# Patient Record
Sex: Male | Born: 1935 | Race: White | Hispanic: No | Smoking: Former smoker
Health system: Southern US, Community
[De-identification: ages and names within clinical notes are randomized; demographics above are authoritative.]

## PROBLEM LIST (undated history)

## (undated) DIAGNOSIS — D631 Anemia in chronic kidney disease: Secondary | ICD-10-CM

## (undated) DIAGNOSIS — Z9289 Personal history of other medical treatment: Secondary | ICD-10-CM

## (undated) DIAGNOSIS — K7469 Other cirrhosis of liver: Secondary | ICD-10-CM

## (undated) DIAGNOSIS — I10 Essential (primary) hypertension: Secondary | ICD-10-CM

## (undated) DIAGNOSIS — I3139 Other pericardial effusion (noninflammatory): Secondary | ICD-10-CM

## (undated) DIAGNOSIS — I2510 Atherosclerotic heart disease of native coronary artery without angina pectoris: Secondary | ICD-10-CM

## (undated) DIAGNOSIS — I219 Acute myocardial infarction, unspecified: Secondary | ICD-10-CM

## (undated) DIAGNOSIS — F32A Depression, unspecified: Secondary | ICD-10-CM

## (undated) DIAGNOSIS — J9819 Other pulmonary collapse: Secondary | ICD-10-CM

## (undated) DIAGNOSIS — J90 Pleural effusion, not elsewhere classified: Secondary | ICD-10-CM

## (undated) DIAGNOSIS — B029 Zoster without complications: Principal | ICD-10-CM

## (undated) DIAGNOSIS — I639 Cerebral infarction, unspecified: Secondary | ICD-10-CM

## (undated) DIAGNOSIS — E119 Type 2 diabetes mellitus without complications: Secondary | ICD-10-CM

## (undated) DIAGNOSIS — N184 Chronic kidney disease, stage 4 (severe): Secondary | ICD-10-CM

## (undated) DIAGNOSIS — M109 Gout, unspecified: Secondary | ICD-10-CM

## (undated) DIAGNOSIS — M1990 Unspecified osteoarthritis, unspecified site: Secondary | ICD-10-CM

## (undated) DIAGNOSIS — R0602 Shortness of breath: Secondary | ICD-10-CM

## (undated) DIAGNOSIS — J449 Chronic obstructive pulmonary disease, unspecified: Secondary | ICD-10-CM

## (undated) DIAGNOSIS — Z8673 Personal history of transient ischemic attack (TIA), and cerebral infarction without residual deficits: Secondary | ICD-10-CM

## (undated) DIAGNOSIS — N183 Chronic kidney disease, stage 3 unspecified: Secondary | ICD-10-CM

## (undated) DIAGNOSIS — I313 Pericardial effusion (noninflammatory): Secondary | ICD-10-CM

## (undated) DIAGNOSIS — F329 Major depressive disorder, single episode, unspecified: Secondary | ICD-10-CM

## (undated) DIAGNOSIS — Z8489 Family history of other specified conditions: Secondary | ICD-10-CM

## (undated) HISTORY — DX: Zoster without complications: B02.9

## (undated) HISTORY — PX: ANKLE ARTHROPLASTY: SUR68

## (undated) HISTORY — PX: CORONARY ANGIOPLASTY WITH STENT PLACEMENT: SHX49

---

## 1969-04-28 HISTORY — PX: HEMORROIDECTOMY: SUR656

## 1969-04-28 HISTORY — PX: SHOULDER SURGERY: SHX246

## 1992-08-28 DIAGNOSIS — E119 Type 2 diabetes mellitus without complications: Secondary | ICD-10-CM

## 1992-08-28 HISTORY — DX: Type 2 diabetes mellitus without complications: E11.9

## 1999-11-25 ENCOUNTER — Encounter: Payer: Self-pay | Admitting: Orthopedic Surgery

## 1999-11-25 ENCOUNTER — Encounter: Admission: RE | Admit: 1999-11-25 | Discharge: 1999-11-25 | Payer: Self-pay | Admitting: Orthopedic Surgery

## 1999-11-28 ENCOUNTER — Ambulatory Visit (HOSPITAL_BASED_OUTPATIENT_CLINIC_OR_DEPARTMENT_OTHER): Admission: RE | Admit: 1999-11-28 | Discharge: 1999-11-29 | Payer: Self-pay | Admitting: Orthopedic Surgery

## 2001-03-06 ENCOUNTER — Ambulatory Visit (HOSPITAL_COMMUNITY): Admission: RE | Admit: 2001-03-06 | Discharge: 2001-03-06 | Payer: Self-pay | Admitting: Gastroenterology

## 2001-03-06 ENCOUNTER — Encounter (INDEPENDENT_AMBULATORY_CARE_PROVIDER_SITE_OTHER): Payer: Self-pay | Admitting: Specialist

## 2004-04-26 ENCOUNTER — Encounter: Admission: RE | Admit: 2004-04-26 | Discharge: 2004-04-26 | Payer: Self-pay | Admitting: Family Medicine

## 2004-08-12 ENCOUNTER — Inpatient Hospital Stay (HOSPITAL_COMMUNITY): Admission: EM | Admit: 2004-08-12 | Discharge: 2004-08-16 | Payer: Self-pay | Admitting: Emergency Medicine

## 2005-09-13 ENCOUNTER — Encounter: Admission: RE | Admit: 2005-09-13 | Discharge: 2005-09-13 | Payer: Self-pay | Admitting: Cardiology

## 2005-09-14 ENCOUNTER — Inpatient Hospital Stay (HOSPITAL_BASED_OUTPATIENT_CLINIC_OR_DEPARTMENT_OTHER): Admission: RE | Admit: 2005-09-14 | Discharge: 2005-09-14 | Payer: Self-pay | Admitting: Cardiology

## 2005-09-18 ENCOUNTER — Ambulatory Visit (HOSPITAL_COMMUNITY): Admission: RE | Admit: 2005-09-18 | Discharge: 2005-09-19 | Payer: Self-pay | Admitting: Cardiology

## 2006-05-17 ENCOUNTER — Encounter: Admission: RE | Admit: 2006-05-17 | Discharge: 2006-05-17 | Payer: Self-pay | Admitting: Cardiology

## 2006-05-23 ENCOUNTER — Inpatient Hospital Stay (HOSPITAL_COMMUNITY): Admission: AD | Admit: 2006-05-23 | Discharge: 2006-05-29 | Payer: Self-pay | Admitting: Cardiology

## 2006-05-24 ENCOUNTER — Encounter (INDEPENDENT_AMBULATORY_CARE_PROVIDER_SITE_OTHER): Payer: Self-pay | Admitting: Cardiology

## 2006-05-25 ENCOUNTER — Encounter (INDEPENDENT_AMBULATORY_CARE_PROVIDER_SITE_OTHER): Payer: Self-pay | Admitting: Specialist

## 2006-05-28 DIAGNOSIS — I639 Cerebral infarction, unspecified: Secondary | ICD-10-CM

## 2006-05-28 HISTORY — PX: PERICARDIAL WINDOW: SHX2213

## 2006-05-28 HISTORY — DX: Cerebral infarction, unspecified: I63.9

## 2006-06-05 ENCOUNTER — Encounter: Admission: RE | Admit: 2006-06-05 | Discharge: 2006-06-05 | Payer: Self-pay | Admitting: Cardiology

## 2006-06-13 ENCOUNTER — Ambulatory Visit: Payer: Self-pay | Admitting: Critical Care Medicine

## 2006-06-13 ENCOUNTER — Inpatient Hospital Stay (HOSPITAL_COMMUNITY): Admission: EM | Admit: 2006-06-13 | Discharge: 2006-06-21 | Payer: Self-pay | Admitting: Emergency Medicine

## 2006-06-13 HISTORY — PX: CEREBRAL ANGIOGRAM: SHX1326

## 2006-06-14 ENCOUNTER — Encounter (INDEPENDENT_AMBULATORY_CARE_PROVIDER_SITE_OTHER): Payer: Self-pay | Admitting: Neurology

## 2006-06-14 ENCOUNTER — Encounter (INDEPENDENT_AMBULATORY_CARE_PROVIDER_SITE_OTHER): Payer: Self-pay | Admitting: *Deleted

## 2006-06-20 ENCOUNTER — Ambulatory Visit: Payer: Self-pay | Admitting: Physical Medicine & Rehabilitation

## 2006-06-21 ENCOUNTER — Inpatient Hospital Stay (HOSPITAL_COMMUNITY)
Admission: RE | Admit: 2006-06-21 | Discharge: 2006-06-29 | Payer: Self-pay | Admitting: Physical Medicine & Rehabilitation

## 2006-06-29 ENCOUNTER — Emergency Department (HOSPITAL_COMMUNITY): Admission: EM | Admit: 2006-06-29 | Discharge: 2006-06-29 | Payer: Self-pay | Admitting: Emergency Medicine

## 2006-07-09 ENCOUNTER — Emergency Department (HOSPITAL_COMMUNITY): Admission: EM | Admit: 2006-07-09 | Discharge: 2006-07-09 | Payer: Self-pay | Admitting: Emergency Medicine

## 2006-08-07 ENCOUNTER — Ambulatory Visit: Payer: Self-pay | Admitting: Physical Medicine & Rehabilitation

## 2006-08-07 ENCOUNTER — Encounter
Admission: RE | Admit: 2006-08-07 | Discharge: 2006-11-05 | Payer: Self-pay | Admitting: Physical Medicine & Rehabilitation

## 2006-08-27 ENCOUNTER — Encounter
Admission: RE | Admit: 2006-08-27 | Discharge: 2006-10-09 | Payer: Self-pay | Admitting: Physical Medicine & Rehabilitation

## 2006-08-30 ENCOUNTER — Emergency Department (HOSPITAL_COMMUNITY): Admission: EM | Admit: 2006-08-30 | Discharge: 2006-08-30 | Payer: Self-pay | Admitting: Family Medicine

## 2006-10-09 ENCOUNTER — Encounter
Admission: RE | Admit: 2006-10-09 | Discharge: 2007-01-07 | Payer: Self-pay | Admitting: Physical Medicine & Rehabilitation

## 2006-10-11 ENCOUNTER — Ambulatory Visit: Payer: Self-pay | Admitting: Physical Medicine & Rehabilitation

## 2006-11-02 ENCOUNTER — Encounter: Admission: RE | Admit: 2006-11-02 | Discharge: 2006-11-02 | Payer: Self-pay | Admitting: Gastroenterology

## 2007-02-21 ENCOUNTER — Emergency Department (HOSPITAL_COMMUNITY): Admission: EM | Admit: 2007-02-21 | Discharge: 2007-02-21 | Payer: Self-pay | Admitting: Emergency Medicine

## 2007-03-08 ENCOUNTER — Ambulatory Visit: Payer: Self-pay | Admitting: Pulmonary Disease

## 2007-03-12 ENCOUNTER — Ambulatory Visit: Payer: Self-pay | Admitting: Cardiovascular Disease

## 2007-03-21 ENCOUNTER — Encounter: Payer: Self-pay | Admitting: Critical Care Medicine

## 2007-03-21 ENCOUNTER — Encounter: Payer: Self-pay | Admitting: Pulmonary Disease

## 2007-03-21 ENCOUNTER — Ambulatory Visit: Payer: Self-pay | Admitting: Pulmonary Disease

## 2007-03-21 ENCOUNTER — Other Ambulatory Visit: Admission: RE | Admit: 2007-03-21 | Discharge: 2007-03-21 | Payer: Self-pay | Admitting: Pediatrics

## 2007-03-21 LAB — CONVERTED CEMR LAB
AST: 26 units/L (ref 0–37)
Albumin: 3.1 g/dL — ABNORMAL LOW (ref 3.5–5.2)
Total Bilirubin: 1.3 mg/dL — ABNORMAL HIGH (ref 0.3–1.2)

## 2007-03-22 ENCOUNTER — Encounter: Payer: Self-pay | Admitting: Critical Care Medicine

## 2007-03-22 LAB — CONVERTED CEMR LAB: LD, Fluid: 96 units/L — ABNORMAL HIGH (ref 3–23)

## 2007-03-27 ENCOUNTER — Ambulatory Visit: Payer: Self-pay | Admitting: Gastroenterology

## 2007-06-11 ENCOUNTER — Ambulatory Visit: Payer: Self-pay | Admitting: Pulmonary Disease

## 2007-06-28 ENCOUNTER — Ambulatory Visit: Payer: Self-pay | Admitting: Pulmonary Disease

## 2007-07-16 ENCOUNTER — Ambulatory Visit: Payer: Self-pay | Admitting: Pulmonary Disease

## 2007-08-29 HISTORY — PX: CATARACT EXTRACTION W/ INTRAOCULAR LENS  IMPLANT, BILATERAL: SHX1307

## 2008-04-24 ENCOUNTER — Encounter (HOSPITAL_COMMUNITY): Admission: RE | Admit: 2008-04-24 | Discharge: 2008-06-30 | Payer: Self-pay | Admitting: Gastroenterology

## 2008-09-10 ENCOUNTER — Encounter: Payer: Self-pay | Admitting: Pulmonary Disease

## 2008-12-03 ENCOUNTER — Encounter (HOSPITAL_COMMUNITY): Admission: RE | Admit: 2008-12-03 | Discharge: 2009-03-03 | Payer: Self-pay | Admitting: Gastroenterology

## 2008-12-03 ENCOUNTER — Encounter: Admission: RE | Admit: 2008-12-03 | Discharge: 2008-12-03 | Payer: Self-pay | Admitting: Gastroenterology

## 2008-12-22 ENCOUNTER — Ambulatory Visit: Payer: Self-pay | Admitting: Pulmonary Disease

## 2008-12-22 DIAGNOSIS — J4489 Other specified chronic obstructive pulmonary disease: Secondary | ICD-10-CM

## 2008-12-22 DIAGNOSIS — J449 Chronic obstructive pulmonary disease, unspecified: Secondary | ICD-10-CM | POA: Insufficient documentation

## 2008-12-22 DIAGNOSIS — J90 Pleural effusion, not elsewhere classified: Secondary | ICD-10-CM | POA: Insufficient documentation

## 2008-12-22 HISTORY — DX: Other specified chronic obstructive pulmonary disease: J44.89

## 2008-12-22 HISTORY — DX: Chronic obstructive pulmonary disease, unspecified: J44.9

## 2009-05-16 ENCOUNTER — Emergency Department (HOSPITAL_COMMUNITY): Admission: EM | Admit: 2009-05-16 | Discharge: 2009-05-16 | Payer: Self-pay | Admitting: Emergency Medicine

## 2009-06-08 ENCOUNTER — Encounter (HOSPITAL_COMMUNITY): Admission: RE | Admit: 2009-06-08 | Discharge: 2009-09-06 | Payer: Self-pay | Admitting: Nephrology

## 2009-07-02 ENCOUNTER — Encounter: Admission: RE | Admit: 2009-07-02 | Discharge: 2009-07-02 | Payer: Self-pay | Admitting: Family Medicine

## 2009-07-07 ENCOUNTER — Ambulatory Visit: Payer: Self-pay | Admitting: Pulmonary Disease

## 2009-07-09 ENCOUNTER — Ambulatory Visit (HOSPITAL_COMMUNITY): Admission: RE | Admit: 2009-07-09 | Discharge: 2009-07-09 | Payer: Self-pay | Admitting: Pulmonary Disease

## 2009-07-09 ENCOUNTER — Encounter: Payer: Self-pay | Admitting: Pulmonary Disease

## 2009-07-09 ENCOUNTER — Encounter (INDEPENDENT_AMBULATORY_CARE_PROVIDER_SITE_OTHER): Payer: Self-pay | Admitting: Interventional Radiology

## 2009-07-19 ENCOUNTER — Ambulatory Visit: Payer: Self-pay | Admitting: Pulmonary Disease

## 2009-08-17 ENCOUNTER — Encounter: Payer: Self-pay | Admitting: Pulmonary Disease

## 2009-09-22 ENCOUNTER — Encounter: Admission: RE | Admit: 2009-09-22 | Discharge: 2009-09-22 | Payer: Self-pay | Admitting: Cardiology

## 2009-09-23 ENCOUNTER — Encounter (HOSPITAL_COMMUNITY): Admission: RE | Admit: 2009-09-23 | Discharge: 2009-12-06 | Payer: Self-pay | Admitting: Cardiology

## 2009-09-24 ENCOUNTER — Ambulatory Visit (HOSPITAL_COMMUNITY): Admission: RE | Admit: 2009-09-24 | Discharge: 2009-09-24 | Payer: Self-pay | Admitting: Cardiology

## 2009-11-29 ENCOUNTER — Encounter (HOSPITAL_COMMUNITY): Admission: RE | Admit: 2009-11-29 | Discharge: 2010-02-27 | Payer: Self-pay | Admitting: Nephrology

## 2009-12-04 ENCOUNTER — Emergency Department (HOSPITAL_COMMUNITY): Admission: EM | Admit: 2009-12-04 | Discharge: 2009-12-04 | Payer: Self-pay | Admitting: Emergency Medicine

## 2009-12-29 ENCOUNTER — Ambulatory Visit: Payer: Self-pay | Admitting: Surgery

## 2009-12-29 ENCOUNTER — Ambulatory Visit: Admission: RE | Admit: 2009-12-29 | Discharge: 2009-12-29 | Payer: Self-pay | Admitting: Gastroenterology

## 2009-12-29 ENCOUNTER — Encounter (INDEPENDENT_AMBULATORY_CARE_PROVIDER_SITE_OTHER): Payer: Self-pay | Admitting: Gastroenterology

## 2010-01-26 ENCOUNTER — Encounter (HOSPITAL_COMMUNITY): Admission: RE | Admit: 2010-01-26 | Discharge: 2010-04-26 | Payer: Self-pay | Admitting: Gastroenterology

## 2010-02-10 ENCOUNTER — Encounter: Admission: RE | Admit: 2010-02-10 | Discharge: 2010-02-10 | Payer: Self-pay | Admitting: Cardiology

## 2010-02-14 ENCOUNTER — Ambulatory Visit (HOSPITAL_COMMUNITY): Admission: RE | Admit: 2010-02-14 | Discharge: 2010-02-14 | Payer: Self-pay | Admitting: Cardiology

## 2010-02-17 ENCOUNTER — Ambulatory Visit: Payer: Self-pay | Admitting: Vascular Surgery

## 2010-03-30 ENCOUNTER — Encounter: Admission: RE | Admit: 2010-03-30 | Discharge: 2010-03-30 | Payer: Self-pay | Admitting: Family Medicine

## 2010-04-30 ENCOUNTER — Emergency Department (HOSPITAL_COMMUNITY): Admission: EM | Admit: 2010-04-30 | Discharge: 2010-05-01 | Payer: Self-pay | Admitting: Emergency Medicine

## 2010-05-03 ENCOUNTER — Encounter: Admission: RE | Admit: 2010-05-03 | Discharge: 2010-05-03 | Payer: Self-pay | Admitting: Cardiology

## 2010-05-10 ENCOUNTER — Encounter (HOSPITAL_COMMUNITY)
Admission: RE | Admit: 2010-05-10 | Discharge: 2010-08-08 | Payer: Self-pay | Source: Home / Self Care | Attending: Nephrology | Admitting: Nephrology

## 2010-05-21 IMAGING — CR DG ABDOMEN ACUTE W/ 1V CHEST
3 series · 3 of 3 positions shown · non-contrast
Comparison: 07/02/2009

CLINICAL DATA: , pain, vomiting.

ACUTE ABDOMEN SERIES (ABDOMEN 2 VIEW & CHEST 1 VIEW)

[w chest pa]
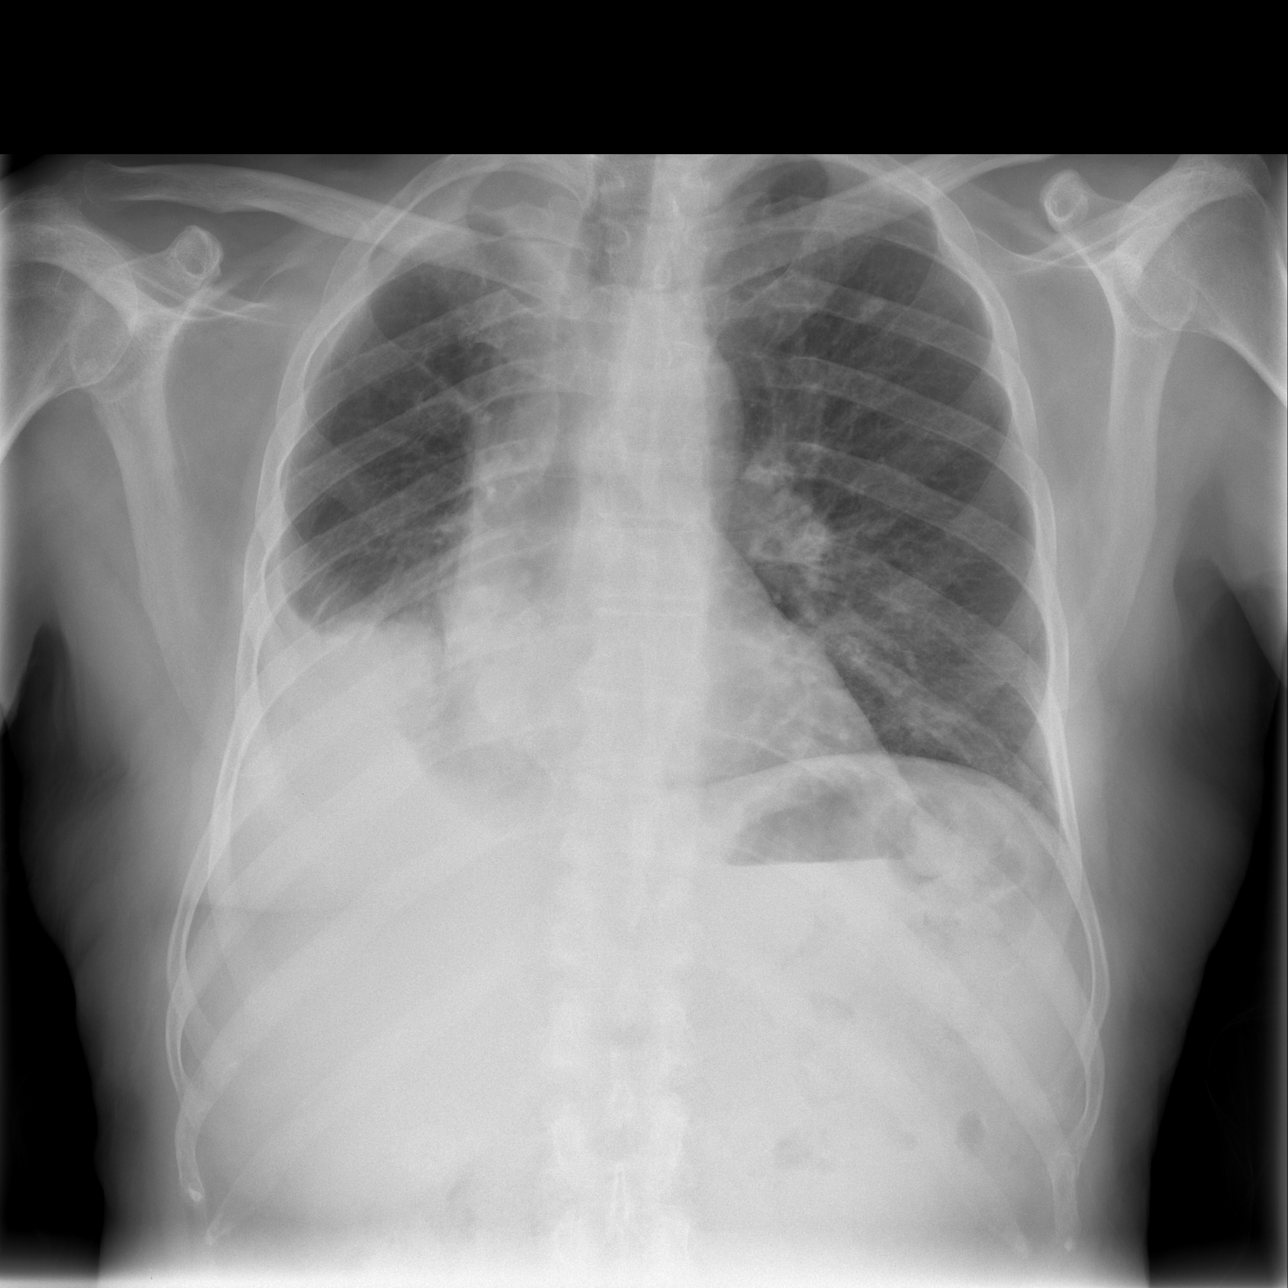

[w abdomen upright *]
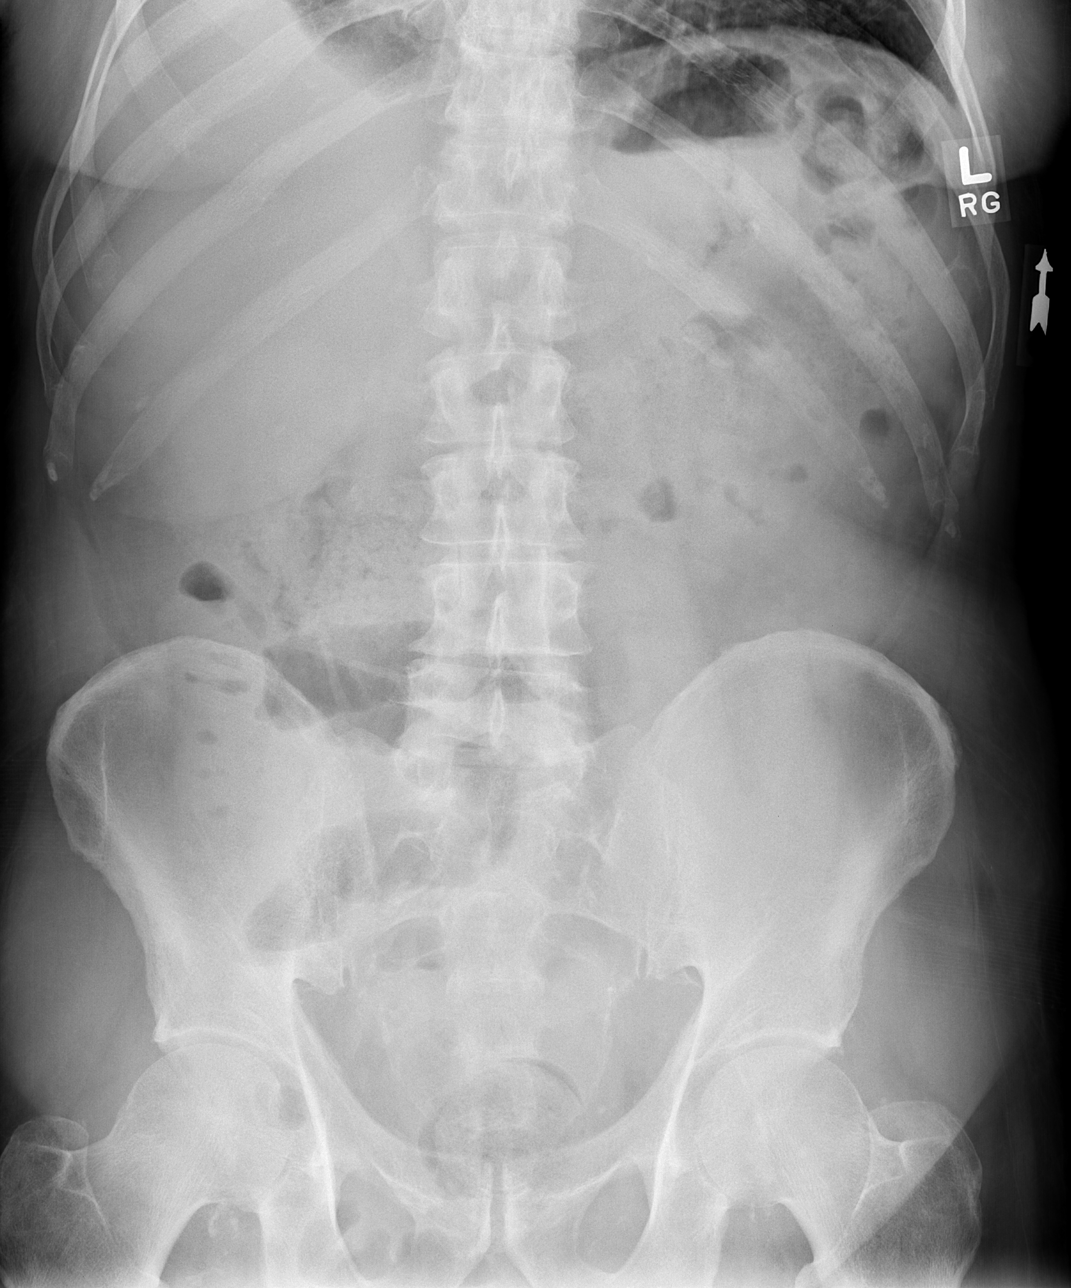

[t abdomen supine]
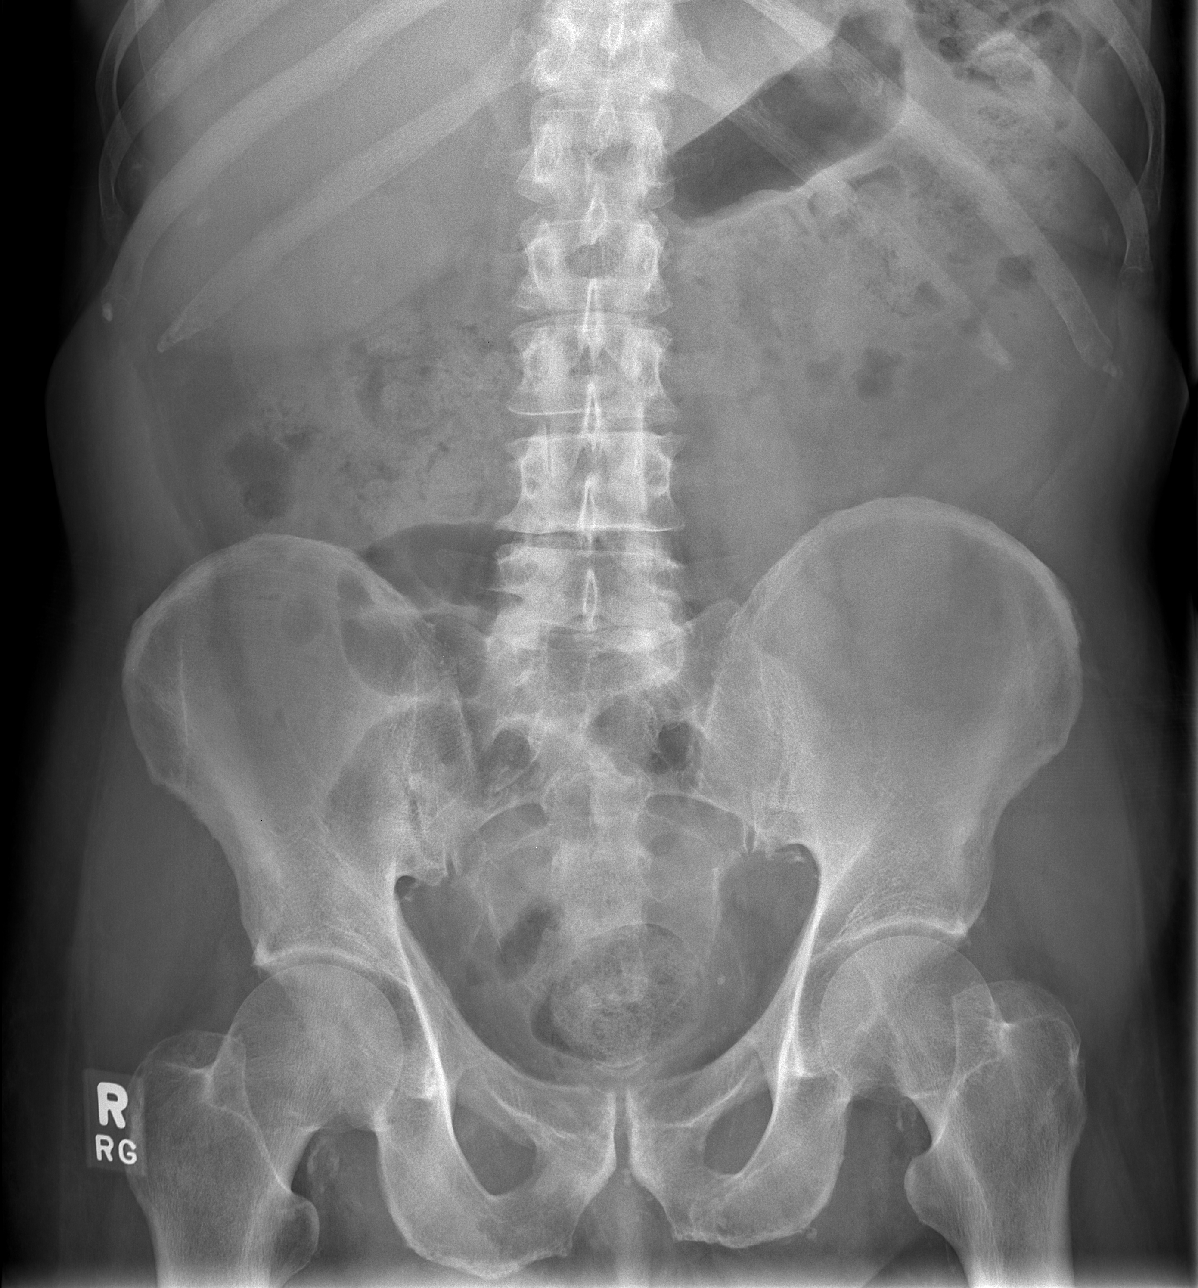

[3 of 3 positions shown; findings below may reference images not displayed]

FINDINGS: Chronic right pleural effusion and right lower lobe
opacities are seen, as seen on prior CT.  Left lung is clear.
Heart is normal size.

There is a nonobstructive bowel gas pattern.  No free air,
organomegaly or suspicious calcification.  No acute bony
abnormality.
IMPRESSION: No acute findings in the abdomen.

Chronic right pleural effusion and right lower lobe opacity.

## 2010-06-15 ENCOUNTER — Emergency Department (HOSPITAL_COMMUNITY): Admission: EM | Admit: 2010-06-15 | Discharge: 2010-06-15 | Payer: Self-pay | Admitting: Family Medicine

## 2010-08-16 ENCOUNTER — Encounter (HOSPITAL_COMMUNITY)
Admission: RE | Admit: 2010-08-16 | Discharge: 2010-09-27 | Payer: Self-pay | Source: Home / Self Care | Attending: Nephrology | Admitting: Nephrology

## 2010-09-27 LAB — IRON AND TIBC
Iron: 43 ug/dL (ref 42–135)
Saturation Ratios: 18 % — ABNORMAL LOW (ref 20–55)
TIBC: 235 ug/dL (ref 215–435)

## 2010-09-27 LAB — RENAL FUNCTION PANEL
BUN: 28 mg/dL — ABNORMAL HIGH (ref 6–23)
CO2: 29 mEq/L (ref 19–32)
Glucose, Bld: 134 mg/dL — ABNORMAL HIGH (ref 70–99)
Potassium: 3.4 mEq/L — ABNORMAL LOW (ref 3.5–5.1)
Sodium: 143 mEq/L (ref 135–145)

## 2010-09-27 LAB — POCT HEMOGLOBIN-HEMACUE: Hemoglobin: 11.4 g/dL — ABNORMAL LOW (ref 13.0–17.0)

## 2010-10-11 ENCOUNTER — Other Ambulatory Visit: Payer: Self-pay

## 2010-10-11 ENCOUNTER — Encounter (HOSPITAL_COMMUNITY): Payer: MEDICARE | Attending: Nephrology

## 2010-10-11 DIAGNOSIS — N184 Chronic kidney disease, stage 4 (severe): Secondary | ICD-10-CM | POA: Insufficient documentation

## 2010-10-11 DIAGNOSIS — D638 Anemia in other chronic diseases classified elsewhere: Secondary | ICD-10-CM | POA: Insufficient documentation

## 2010-10-17 ENCOUNTER — Encounter (HOSPITAL_COMMUNITY): Payer: MEDICARE

## 2010-10-25 ENCOUNTER — Other Ambulatory Visit: Payer: Self-pay | Admitting: Nephrology

## 2010-10-25 ENCOUNTER — Encounter (HOSPITAL_COMMUNITY): Payer: MEDICARE

## 2010-10-25 ENCOUNTER — Other Ambulatory Visit: Payer: Self-pay

## 2010-10-25 LAB — IRON AND TIBC
Iron: 52 ug/dL (ref 42–135)
Saturation Ratios: 23 % (ref 20–55)
TIBC: 225 ug/dL (ref 215–435)

## 2010-10-26 LAB — POCT HEMOGLOBIN-HEMACUE: Hemoglobin: 11.2 g/dL — ABNORMAL LOW (ref 13.0–17.0)

## 2010-11-07 LAB — POCT HEMOGLOBIN-HEMACUE: Hemoglobin: 10.8 g/dL — ABNORMAL LOW (ref 13.0–17.0)

## 2010-11-07 LAB — PTH, INTACT AND CALCIUM
Calcium, Total (PTH): 8.6 mg/dL (ref 8.4–10.5)
PTH: 85.2 pg/mL — ABNORMAL HIGH (ref 14.0–72.0)

## 2010-11-08 ENCOUNTER — Other Ambulatory Visit: Payer: Self-pay

## 2010-11-08 ENCOUNTER — Encounter (HOSPITAL_COMMUNITY): Payer: MEDICARE | Attending: Nephrology

## 2010-11-08 DIAGNOSIS — N184 Chronic kidney disease, stage 4 (severe): Secondary | ICD-10-CM | POA: Insufficient documentation

## 2010-11-08 DIAGNOSIS — D638 Anemia in other chronic diseases classified elsewhere: Secondary | ICD-10-CM | POA: Insufficient documentation

## 2010-11-08 LAB — FERRITIN: Ferritin: 200 ng/mL (ref 22–322)

## 2010-11-08 LAB — IRON AND TIBC
Saturation Ratios: 18 % — ABNORMAL LOW (ref 20–55)
TIBC: 219 ug/dL (ref 215–435)
UIBC: 180 ug/dL

## 2010-11-09 LAB — POCT URINALYSIS DIPSTICK
Bilirubin Urine: NEGATIVE
Glucose, UA: NEGATIVE mg/dL
Ketones, ur: NEGATIVE mg/dL
Specific Gravity, Urine: 1.01 (ref 1.005–1.030)

## 2010-11-09 LAB — POCT HEMOGLOBIN-HEMACUE
Hemoglobin: 10.7 g/dL — ABNORMAL LOW (ref 13.0–17.0)
Hemoglobin: 10.1 g/dL — ABNORMAL LOW (ref 13.0–17.0)

## 2010-11-09 LAB — IRON AND TIBC
Iron: 59 ug/dL (ref 42–135)
UIBC: 186 ug/dL

## 2010-11-10 LAB — POCT I-STAT, CHEM 8
BUN: 34 mg/dL — ABNORMAL HIGH (ref 6–23)
Creatinine, Ser: 2.1 mg/dL — ABNORMAL HIGH (ref 0.4–1.5)
Hemoglobin: 13.3 g/dL (ref 13.0–17.0)
Potassium: 5 mEq/L (ref 3.5–5.1)
Sodium: 139 mEq/L (ref 135–145)
TCO2: 29 mmol/L (ref 0–100)

## 2010-11-10 LAB — RENAL FUNCTION PANEL
BUN: 26 mg/dL — ABNORMAL HIGH (ref 6–23)
CO2: 29 mEq/L (ref 19–32)
Chloride: 102 mEq/L (ref 96–112)
Creatinine, Ser: 1.74 mg/dL — ABNORMAL HIGH (ref 0.4–1.5)
GFR calc non Af Amer: 38 mL/min — ABNORMAL LOW (ref >60)
Potassium: 4.7 mEq/L (ref 3.5–5.1)

## 2010-11-10 LAB — IRON AND TIBC
Iron: 36 ug/dL — ABNORMAL LOW (ref 42–135)
Iron: 64 ug/dL (ref 42–135)
Saturation Ratios: 15 % — ABNORMAL LOW (ref 20–55)
Saturation Ratios: 27 % (ref 20–55)
TIBC: 247 ug/dL (ref 215–435)
TIBC: 237 ug/dL (ref 215–435)
UIBC: 211 ug/dL

## 2010-11-10 LAB — POCT HEMOGLOBIN-HEMACUE
Hemoglobin: 11 g/dL — ABNORMAL LOW (ref 13.0–17.0)
Hemoglobin: 12.5 g/dL — ABNORMAL LOW (ref 13.0–17.0)

## 2010-11-11 LAB — POCT HEMOGLOBIN-HEMACUE
Hemoglobin: 14.1 g/dL (ref 13.0–17.0)
Hemoglobin: 14.2 g/dL (ref 13.0–17.0)

## 2010-11-11 LAB — FERRITIN: Ferritin: 635 ng/mL — ABNORMAL HIGH (ref 22–322)

## 2010-11-11 LAB — RENAL FUNCTION PANEL
Albumin: 3.2 g/dL — ABNORMAL LOW (ref 3.5–5.2)
BUN: 22 mg/dL (ref 6–23)
Chloride: 103 mEq/L (ref 96–112)
Creatinine, Ser: 1.85 mg/dL — ABNORMAL HIGH (ref 0.4–1.5)
Glucose, Bld: 198 mg/dL — ABNORMAL HIGH (ref 70–99)
Potassium: 4 mEq/L (ref 3.5–5.1)

## 2010-11-11 LAB — IRON AND TIBC
Iron: 51 ug/dL (ref 42–135)
Saturation Ratios: 23 % (ref 20–55)
TIBC: 224 ug/dL (ref 215–435)

## 2010-11-13 LAB — IRON AND TIBC
Iron: 17 ug/dL — ABNORMAL LOW (ref 42–135)
Saturation Ratios: 5 % — ABNORMAL LOW (ref 20–55)
UIBC: 305 ug/dL

## 2010-11-13 LAB — CROSSMATCH

## 2010-11-13 LAB — POCT HEMOGLOBIN-HEMACUE
Hemoglobin: 10.7 g/dL — ABNORMAL LOW (ref 13.0–17.0)
Hemoglobin: 8.7 g/dL — ABNORMAL LOW (ref 13.0–17.0)

## 2010-11-13 LAB — FERRITIN: Ferritin: 12 ng/mL — ABNORMAL LOW (ref 22–322)

## 2010-11-14 LAB — COMPREHENSIVE METABOLIC PANEL
Albumin: 3.3 g/dL — ABNORMAL LOW (ref 3.5–5.2)
BUN: 28 mg/dL — ABNORMAL HIGH (ref 6–23)
Calcium: 8.7 mg/dL (ref 8.4–10.5)
Creatinine, Ser: 2.07 mg/dL — ABNORMAL HIGH (ref 0.4–1.5)
Total Protein: 6.4 g/dL (ref 6.0–8.3)

## 2010-11-14 LAB — BODY FLUID CULTURE
Culture: NO GROWTH
Culture: NO GROWTH
Gram Stain: NONE SEEN

## 2010-11-14 LAB — POCT HEMOGLOBIN-HEMACUE
Hemoglobin: 10 g/dL — ABNORMAL LOW (ref 13.0–17.0)
Hemoglobin: 8.6 g/dL — ABNORMAL LOW (ref 13.0–17.0)
Hemoglobin: 8.9 g/dL — ABNORMAL LOW (ref 13.0–17.0)
Hemoglobin: 9.7 g/dL — ABNORMAL LOW (ref 13.0–17.0)

## 2010-11-14 LAB — PATHOLOGIST SMEAR REVIEW

## 2010-11-14 LAB — BODY FLUID CELL COUNT WITH DIFFERENTIAL
Eos, Fluid: 0 %
Monocyte-Macrophage-Serous Fluid: 14 % — ABNORMAL LOW (ref 50–90)
Other Cells, Fluid: 0 %
Total Nucleated Cell Count, Fluid: 128 cu mm (ref 0–1000)
Total Nucleated Cell Count, Fluid: 290 cu mm (ref 0–1000)

## 2010-11-14 LAB — LACTATE DEHYDROGENASE, PLEURAL OR PERITONEAL FLUID
LD, Fluid: 58 U/L — ABNORMAL HIGH (ref 3–23)
LD, Fluid: 63 U/L — ABNORMAL HIGH (ref 3–23)

## 2010-11-14 LAB — PROTEIN, BODY FLUID: Total protein, fluid: <3 g/dL

## 2010-11-14 LAB — GLUCOSE, CAPILLARY
Glucose-Capillary: 129 mg/dL — ABNORMAL HIGH (ref 70–99)
Glucose-Capillary: 192 mg/dL — ABNORMAL HIGH (ref 70–99)

## 2010-11-14 LAB — IRON AND TIBC
Iron: 46 ug/dL (ref 42–135)
Saturation Ratios: 16 % — ABNORMAL LOW (ref 20–55)
TIBC: 281 ug/dL (ref 215–435)
UIBC: 235 ug/dL

## 2010-11-14 LAB — CROSSMATCH: Antibody Screen: NEGATIVE

## 2010-11-15 LAB — POCT HEMOGLOBIN-HEMACUE: Hemoglobin: 9.7 g/dL — ABNORMAL LOW (ref 13.0–17.0)

## 2010-11-15 LAB — RENAL FUNCTION PANEL
CO2: 27 mEq/L (ref 19–32)
Calcium: 8.6 mg/dL (ref 8.4–10.5)
Glucose, Bld: 88 mg/dL (ref 70–99)
Sodium: 139 mEq/L (ref 135–145)

## 2010-11-16 LAB — GLUCOSE, CAPILLARY

## 2010-11-16 LAB — POCT HEMOGLOBIN-HEMACUE: Hemoglobin: 9.3 g/dL — ABNORMAL LOW (ref 13.0–17.0)

## 2010-11-22 ENCOUNTER — Other Ambulatory Visit: Payer: Self-pay | Admitting: Nephrology

## 2010-11-22 ENCOUNTER — Encounter (HOSPITAL_COMMUNITY): Payer: MEDICARE

## 2010-11-22 LAB — IRON AND TIBC: Saturation Ratios: 16 % — ABNORMAL LOW (ref 20–55)

## 2010-11-29 ENCOUNTER — Encounter (HOSPITAL_COMMUNITY): Payer: MEDICARE | Attending: Nephrology

## 2010-11-29 DIAGNOSIS — N184 Chronic kidney disease, stage 4 (severe): Secondary | ICD-10-CM | POA: Insufficient documentation

## 2010-11-29 DIAGNOSIS — D638 Anemia in other chronic diseases classified elsewhere: Secondary | ICD-10-CM | POA: Insufficient documentation

## 2010-11-30 LAB — BODY FLUID CELL COUNT WITH DIFFERENTIAL
Eos, Fluid: 0 %
Neutrophil Count, Fluid: 0 % (ref 0–25)
Total Nucleated Cell Count, Fluid: 345 cu mm (ref 0–1000)

## 2010-11-30 LAB — BODY FLUID CULTURE
Culture: NO GROWTH
Gram Stain: NONE SEEN

## 2010-11-30 LAB — LACTATE DEHYDROGENASE, PLEURAL OR PERITONEAL FLUID

## 2010-12-06 ENCOUNTER — Encounter (HOSPITAL_COMMUNITY): Payer: MEDICARE

## 2010-12-06 ENCOUNTER — Other Ambulatory Visit: Payer: Self-pay | Admitting: Nephrology

## 2010-12-06 ENCOUNTER — Other Ambulatory Visit: Payer: Self-pay | Admitting: Cardiology

## 2010-12-06 ENCOUNTER — Ambulatory Visit
Admission: RE | Admit: 2010-12-06 | Discharge: 2010-12-06 | Disposition: A | Discharge status: Home / Self Care | Payer: MEDICARE | Source: Ambulatory Visit | Attending: Cardiology | Admitting: Cardiology

## 2010-12-06 DIAGNOSIS — J90 Pleural effusion, not elsewhere classified: Secondary | ICD-10-CM

## 2010-12-06 LAB — CROSSMATCH
ABO/RH(D): O NEG
Antibody Screen: NEGATIVE

## 2010-12-06 LAB — GLUCOSE, CAPILLARY: Glucose-Capillary: 209 mg/dL — ABNORMAL HIGH (ref 70–99)

## 2010-12-07 LAB — CROSSMATCH: ABO/RH(D): O NEG

## 2010-12-20 ENCOUNTER — Encounter (HOSPITAL_COMMUNITY): Payer: MEDICARE

## 2010-12-20 ENCOUNTER — Other Ambulatory Visit: Payer: Self-pay | Admitting: Nephrology

## 2011-01-03 ENCOUNTER — Other Ambulatory Visit: Payer: Self-pay | Admitting: Nephrology

## 2011-01-03 ENCOUNTER — Encounter (HOSPITAL_COMMUNITY): Payer: MEDICARE | Attending: Nephrology

## 2011-01-03 DIAGNOSIS — N184 Chronic kidney disease, stage 4 (severe): Secondary | ICD-10-CM | POA: Insufficient documentation

## 2011-01-03 DIAGNOSIS — D638 Anemia in other chronic diseases classified elsewhere: Secondary | ICD-10-CM | POA: Insufficient documentation

## 2011-01-03 LAB — IRON AND TIBC
Iron: 48 ug/dL (ref 42–135)
TIBC: 249 ug/dL (ref 215–435)

## 2011-01-03 LAB — FERRITIN: Ferritin: 621 ng/mL — ABNORMAL HIGH (ref 22–322)

## 2011-01-10 NOTE — Assessment & Plan Note (Signed)
[facility]                             PULMONARY OFFICE NOTE   NAME:Spoerl, SOLLIE VULTAGGIO                       MRN:          [id number]  DATE:03/21/2007                            DOB:          1925/12/25    Mr. Bruington is a 77 year old ex-smoker who presented with a right pleural  effusion.  Thoracentesis revealed transudative fluid with a total  protein less than 3.0 and an LDH of 96.  Cytology was negative.  CT scan  suggested a large volume of ascites and some nodularity of the liver.  He denies extensive alcohol use in the past.   LFTs show an elevated alkaline phosphatase level, an albumin of 3.1 and  mildly increased bilirubin.   ASSESSMENT:  The pleural fluid is transudative and likely a result of  ascites.  An ultrasound of the liver has been scheduled.  He will be  referred to the gastroenterologist for further workup.     Oretha Milch, MD  Electronically Signed    RVA/MedQ  DD: 03/25/2007  DT: 03/26/2007  Job #: [id number]   cc:   Lillia Carmel, M.D.  Georga Hacking, M.D.

## 2011-01-10 NOTE — Letter (Signed)
March 08, 2007    Lillia Carmel, M.D.  [address]  [place], [place] [address]   RE:  KALUP, JAQUITH  MRN:  [id number]  /  DOB:  20-Dec-1925   Dear Dr. Prince Rome,   As you are aware, Mr. Fehnel is a pleasant 77 year old Caucasian  gentleman with expressive aphasia due to an old stroke, was referred for  an evaluation for an abnormal chest x-ray.  He was sent to the emergency  room after his daughter documented a low temperature of 92.  He also was  reporting some soreness in the right hypochondrial region.  Chest x-ray  in the emergency room showed right lower lobe hemiopacity suggestive of  a pleural effusion and some associated atelectasis.  There was mild  shift of the trachea to the right.  I have reviewed these films.  Hence,  he is referred to [place].   On questioning, the daughter reports moderate dyspnea on exertion.  He  has nonproductive cough.  There is no history of wheezing, hemoptysis,  palpitations, or orthopnea.  He has lost about 40 pounds over the last 9  months after a stroke.   PAST MEDICAL HISTORY:  1. Hypertension.  2. Diabetes.  3. CVA in September 2007.  4. Coronary artery disease status post MI in May '96 and December '05.  5. Has had 2 stents placed.  6. Stroke in October '07 with right hemiparesis and expressive      aphasia.   PAST SURGICAL HISTORY:  1. Cardiac stents.  2. Right ankle surgery.  3. Pericardiocentesis in September '07.   CARDIOLOGIST:  Dr. Donnie Aho   ALLERGIES:  SHELLFISH OR SEAFOOD.   SOCIAL HISTORY:  He smoked for more than 50 pack years, quit smoking in  May '96.  He is now widowed.  His wife died of lung cancer, and he lives  with his daughter.  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   REVIEW OF SYSTEMS:  As described above.  Describes a chronic  nonproductive cough, loss of appetite, weight loss of about 40 pounds,  swelling of both extremities, joint stiffness.   PHYSICAL  EXAMINATION:  Weight 157 pounds, blood pressure 112/64, heart  rate 16, and oxygen saturation 97% on room air.  Afebrile.  HEENT:  Normal.  NECK:  Supple, no JVD, no lymphadenopathy.  CVS:  S1, S2, normal.  CHEST:  Decreased breath sounds in the right base.  ABDOMEN:  Soft, nontender, no organomegaly.  NEUROLOGIC:  Nonfocal, right side hemiparesis, expressive aphasia.  EXTREMITIES:  2+ edema.   LABORATORY DATA:  From June 26, '08, pro time was 1.3, BUN and  creatinine 29/1.8.  WBC count 5.8, hemoglobin 11.5, platelets 228.   IMPRESSION:  1. Right pleural effusion.  2. Right atelectasis.  3. Cerebrovascular accident in September '07, with a right hemiparesis      and expressive aphasia.  4. Coronary artery disease.   I do note a normal chest x-ray from October '07.  These findings are  very suspicious for malignancy.   RECOMMEND:  A CT chest will be scheduled.  Due to renal insufficiency,  contrast will not be used.  Further management will be based on the  results.  If a lung mass is visualized, my approach would probably be a  bronchoscopy so that adequate biopsies can be obtained.  On  the other hand, if there is significant amount of fluid, then we  may try  to obtain pleural fluid cytology, and a thoracentesis may be indicated.  I have discussed this approach with the daughter and will await the CT  scan.    Sincerely,      Oretha Milch, MD  Electronically Signed    RVA/MedQ  DD: 03/08/2007  DT: 03/09/2007  Job #: [id number]   CC:    Georga Hacking, M.D.

## 2011-01-10 NOTE — Procedures (Signed)
[facility]                                PROCEDURE NOTE   NAME:Newton, LIONELL MATUSZAK                       MRN:          [id number]  DATE:03/21/2007                            DOB:          [birth year]    PROCEDURE PERFORMED:  Thoracentesis.   With sterile precautions and under local anesthesia, thoracentesis  needle was inserted in the right seventh intercostal space in the  scapular line.  About 10 mL of hemorrhagic fluid was removed.  The  patient tolerated the procedure well.  Chest x-ray post procedure did  not show any presence of pneumothorax and showed presence of the right  pleural effusion.   The pleural fluid will be sent for gram stain, culture, cell count, LDH,  protein, and cytology.   Written and informed consent was obtained from the patient prior to the  procedure.  The risks of the procedure including coughing, bleeding, and  the small chance of lung puncture requiring a chest tube were discussed.     Oretha Milch, MD  Electronically Signed    RVA/MedQ  DD: 03/21/2007  DT: 03/21/2007  Job #: [id number]

## 2011-01-10 NOTE — Procedures (Signed)
DUPLEX DEEP VENOUS EXAM - UPPER EXTREMITY   INDICATION:  Right swollen arm.   HISTORY:  Edema:  Swelling  Trauma/Surgery:  no  Pain:  no  PE:  no  Previous DVT:  no  Anticoagulants:  no  Other:   DUPLEX EXAM:                                             Bas/                IJV   SCV     AXV    BrachV  Ceph V                R  L  R   L   R  L   R   L   R  L  Thrombosis    o  o  o   o   o  o   o   o   o  o  Spontaneous   +  +  +   +   +  +   +   +   +  +  Phasic        +  +  +   +   +  +   +   +   +  +  Augmentation  +  +  +   +   +  +   +   +   +  +  Compressible  +  +  +   +   +  +   +   +   +  +  Competent     +  +  +   +   +  +   +   +   +  +  Legend:  + - yes  o - no  p - partial  D - decreased   IMPRESSION:  1. All veins appear compressible and no evidence of deep venous      thrombus.  2. Preliminary report given to [facility].   ___________________________________________  Di Kindle. Edilia Bo, M.D.   CB/MEDQ  D:  02/17/2010  T:  02/17/2010  Job:  [id number]

## 2011-01-10 NOTE — Assessment & Plan Note (Signed)
 [facility]                             PULMONARY OFFICE NOTE   NAME:[name], [name]                       MRN:          [id number]  DATE:07/16/2007                            DOB:          1926-08-19    [name] is a pleasant 77 year old with cirrhosis, right pleural  effusion and expressive aphasia due to an old stroke.  He smoked for  more than 50-pack years, quit in May 1996.  He denies dyspnea on  exertion and was able to carry out his daily activities and denies  cough, sputum production.   EXAMINATION:  VITAL SIGNS:  Weight 154 pounds, temperature 98.1, blood  pressure 102/72, oxygen saturation 99% on room air.  CHEST:  Decreased breath sounds, right base.  CV:  S1, S2 normal, no S3.  EXTREMITIES:  1+ edema.   CURRENT MEDICATIONS:  Include:  1. Humulin insulin as directed.  2. Aspirin 81 mg q. day.  3. Aggrenox 25/20 mg b.i.d.  4. Bunamidine.  5. Travatan eye drops.  6. Furosemide 20 mg q.d.  7. Aldactone 50 mg q.d.   Spirometry showed an FEV1 of 71%, an FEV1/ FEVC ratio of 69% consistent  with mild airway obstruction.  There was no significant bronchodilator  response.   IMPRESSION:  1. Mild chronic obstructive pulmonary disease.  2. Non-alcoholic cirrhosis with elevated alkaline phosphatase and      negative serology.  3. Right transudative pleural effusion.   RECOMMENDATION:  1. Metoprolol has been discontinued due to lower blood pressure.  2. HE IS ALLERGIC TO SHELLFISH and does not want a flu shot.  He has      received Pneumovax about 2 years ago.  3. Bronchodilators will be considered, if he is symptomatic in the      future.  He will return for a followup in about 6 months.     Oretha Milch, MD  Electronically Signed    RVA/MedQ  DD: 07/16/2007  DT: 07/17/2007  Job #: [id number]   cc:   Everardo All. Madilyn Fireman, M.D.  Lillia Carmel, M.D.

## 2011-01-13 NOTE — H&P (Signed)
[name], [name] NO.:  [id number]   MEDICAL RECORD NO.:  [id number]          PATIENT TYPE:  INP   LOCATION:  4709                         FACILITY:  [facility]   PHYSICIAN:  Georga Hacking, M.D.DATE OF BIRTH:  1926/04/14   DATE OF ADMISSION:  05/23/2006  DATE OF DISCHARGE:                                HISTORY & PHYSICAL   HISTORY:  The patient is a 77 year old male who is admitted to the hospital  for treatment of hypotension, dyspnea, pericardial effusion and anemia.  The  patient has a complex past medical history.  He has a previous history of  hypertension and diabetes as well as coronary artery disease.  He has  previous stent placement by Dr. Elsie Lincoln in 1996.  He had a catheterization in  December of 2005 with an occluded right coronary infarct.  He had two Cypher  stents placed to the right coronary artery by Dr. Lenise Herald.  Following  this he developed significant skin rash that has been persistent.  He was  additionally tried on Benadryl.  He was unwilling to take Plavix or Ticlid.  He developed recurrence of angina that was stable and occurred at a high  work load but he was uninterested in stress testing when last seen in  October.  He developed more frequent angina and catheterization showed a  moderately severe stenosis in the intermediate, LAD and circumflex without  much disease.  Posterior descending artery was occluded with collateral  filling or bidirectional flow and he had a significant stenosis in the mid  right coronary artery that was in stent restenosis.  At that time he  underwent cutting balloon angioplasty of the right coronary artery and did  well following that.  He was not able to participate in cardiac rehab.  This  was done in January.  He has continued to have a skin rash.  He has had no  recurrence of angina and has been seeing the dermatologist about the skin  rash.  He was seen on May 17, 2006 for evaluation of  edema and  possible congestive heart failure, malaise, fatigue and dyspnea.  He felt as  if his skin rash was not due to Plavix and has been seeing the  dermatologist.  He was seen at Dr. Prince Rome with progressive pedal edema which  had worsened to the point where his legs would drain and weep.  He did not  have angina but noted progressive fatigue and dyspnea on exertion.  He was  treated with Lasix by Dr. Prince Rome which resulted in improvement in his lower  extremities.  He has also developed worsening renal insufficiency and has  seen Dr. Caryn Section in the past for this.  When he was seen in the office  previously his BNP level was really not elevated to the point it was thought  he should have heart failure.  He had a chest x-ray showing a stable left  upper lobe granuloma, normal heart size and no evidence for heart failure.  An echocardiogram done on the 24th showed the presence of a moderately large  pericardial effusion.  There was no definite evidence of tamponade noted.  He had left ventricular hypertrophy with normal systolic function.  He was  seen back in the office today and has had one syncopal episode since then  where he fell and hurt his head.  He was noted to be significantly anemic  and his anemic work-up showed normal B12 and folate levels with low iron and  the anemia of chronic disease.  When seen in the office today he was dizzy  and had to be assisted in a wheelchair.  His blood pressure was 70/50.  It  was recommended that he be admitted to the hospital for treatment of the  suspected pericardial effusion, possible dehydration and to further work-up  his hypotension and anemia.  He is unable to walk without any significant  assistance.   PAST MEDICAL HISTORY:  Remarkable for hypertension, previous insulin-  dependent diabetes mellitus, hyperlipidemia, obesity and anemia.   PAST SURGICAL HISTORY:  Ankle surgery, shoulder repair on the right,  hemorrhoidectomy.   ALLERGIES:   NORVASC causes a rash and aching joints. He says he is allergic  to SEA FOOD and also is intolerant to STATINS previously in the past.   MEDICATIONS PRIOR TO ADMISSION:  Included aspirin 81 mg daily, Crestor 20 mg  weekly, Ferrous Gluconate 325 mg daily, Glipizide 10 mg b.i.d., hydroxyzine  25 mg p.r.n., Lantus insulin 100 units per ml 30 units daily, lisinopril 40  mg daily, metoprolol 1/2 tablet of a 50 mg pill b.i.d., Terazosin 4 mg  nightly.  He is on Travatan and brimonidine eye drops b.i.d.   SOCIAL HISTORY:  He is a widower and currently lives with his daughter.  He  does not use alcohol. He used to smoke and quit in 1996.  He is a retired  Psychiatric nurse.   FAMILY HISTORY:  Father died at age of 75 of diabetes and history of heart  failure.  Mother died age 75.  One brother is 77 and is alive and well.   REVIEW OF SYSTEMS:  He has been obese for several years.  He has a history  of psoriasis and also has a chronic skin rash for which he sees a  dermatologist and has been under several treatments.  He has glaucoma and  wears eyeglasses and contact lens.  He has a history of cataracts.  He has  mild partial hearing loss.  He has had significant dyspnea on exertion as  well as malaise and fatigue.  He has had a history of abdominal bloating and  some vague mid abdominal pain.  He has a history of benign prostatic  hypertrophy as well as hesitancy.  He has had arthritis involving his  shoulders.  He has had no history of stroke or TIA.  He has a history of  mild depression.  No history of hypothyroidism.  He does have a history of  SEA FOOD allergy in the past.  Recent BNP level was 201.   IMPRESSION:  1. Pericardial effusion without evidence of tamponade with hypotension.  2. Chronic anemia of uncertain cause.  3. Coronary artery disease with previous stent of the right coronary      artery with previous percutaneous transluminal coronary angioplasty for     in stent restenosis  with a cutting balloon recently.  4. Chronic skin rash of uncertain cause.  5. Insulin-dependent diabetes mellitus.  6. Hyperlipidemia with intolerance to statins.  7. History of right bundle branch block.  8. History of inferior infarction.  9. Hypertension.   RECOMMENDATIONS:  Very complex patient.  He will be admitted to the hospital  and will be hydrated.  All of his antihypertensive medications will be  withheld at this time.  He will have basic lab results.  He may need to have  a renal consultation and we may need to do assessment of his hemodynamics  with a right heart catheterization to exclude pericardial constriction or  other problems.      Georga Hacking, M.D.  Electronically Signed     WST/MEDQ  D:  05/23/2006  T:  05/24/2006  Job:  [id number]   cc:   Lillia Carmel, M.D.

## 2011-01-13 NOTE — Assessment & Plan Note (Signed)
The patient was last seen by me August 09, 2006.  Discharged to [facility] inpatient rehab July 08, 2006.  When I last saw him,  transitioned him from home health therapy to outpatient therapy.  Received OT and speech.  He plateaued in speech.  A therapist  recommended getting a hearing aid evaluation, which he is in the process  of doing, and perhaps resuming therapy after that.  He is finished with  OT and has some home exercises which he does perform with his daughters  supervising.  He is independent with his self care, including dressing,  bathing, hygiene and grooming, and meal prep.  He is only left alone a  few hours at a time.   REVIEW OF SYSTEMS:  Positive for right wrist pain, which is chronic,  from severe osteoarthritis.  He also has chronic right shoulder  limitation.   Positive for confusion.  He had some bruising of the right hand.   PAST MEDICAL HISTORY:  Unchanged.   SOCIAL HISTORY:  He is widowed.  Daughters supervise him.   FAMILY HISTORY:  Heart disease, diabetes.   EXAMINATION:  Blood pressure 112/58, pulse 72, respiratory rate of 16,  O2 sat 97% on room air.  GENERAL:  In no acute distress.  Speech is aphasic.  He has difficulty  with repetition, difficulty receptively as well as expressively, with a  tendency to utilize jargon.   He has decreased range of motion of right shoulder in terms of a  abduction as well as essentially no flexion/extension of the right  wrist, otherwise has good range of motion of his left upper and  bilateral lower extremities.  Sensation could not be assessed secondary  to aphasia.  Skin showed some bruising on the dorsal surface of his  hand, as well as forearm.  Motor strength is 4/5 the right grip; 5 in  the left; 4+ in the biceps, triceps; 4- at the deltoid; 4 in the lower  extremities bilaterally; and 4 in the left upper.  Gait is without any  evidence of toe drag or knee instability, basically normal.   IMPRESSION:   Left middle cerebral artery distribution infarct.  Receptive greater than expressive aphagia, as well as some mild residual  upper extremity weakness complicated by a pre-existing right wrist  contracture as well as mild shoulder contracture.   PLAN:  1. As discussed with the patient, I think he has more or less      plateaued from a physical standpoint, but should get some      spontaneous improvement with time of his speech and at that point      it may be beneficial to resume some speech therapy with him.  2. Recommend follow up with his primary physician, Dr. Prince Rome , as well      as Dr. Donnie Aho, his cardiologist, for his stroke prophylaxis and      general medical care, and with Dr. Pearlean Brownie from neurology.  I will      see him back on a p.r.n. basis.      Erick Colace, M.D.  Electronically Signed     AEK/MedQ  D:  10/11/2006 15:14:26  T:  10/11/2006 16:11:58  Job #:  [id number]

## 2011-01-13 NOTE — Cardiovascular Report (Signed)
[name], CIAVARELLA NO.:  [id number]   MEDICAL RECORD NO.:  [id number]          PATIENT TYPE:  OIB   LOCATION:  1963                         FACILITY:  [facility]   PHYSICIAN:  Georga Hacking, M.D.DATE OF BIRTH:  [birth year]   DATE OF PROCEDURE:  09/14/2005  DATE OF DISCHARGE:                              CARDIAC CATHETERIZATION   HISTORY:  A 77 year old male who is diabetic, who has a history of known  coronary artery disease. He had previous stenting with Cypher stents 2 years  ago but discontinued taking Plavix despite medical advice and would not take  Ticlid. He presented with worsening angina over the past few weeks. He has  had angina since this summer also.   PROCEDURE:  Left heart catheterization with coronary angiograms and left  ventriculogram.   PROCEDURE:  The procedure was done without complications. The right femoral  artery was entered with considerable difficulty due to a lateral and deep  position of his femoral artery. The left main coronary artery is normal.   LEFT ANTERIOR DESCENDING: There is 30-40% proximal stenosis. The left  anterior descending terminates on the distal anterior wall.  A diagonal  branch has a 60% stenosis at its proximal portion. The intermediate branch  arises and has a segmental 80% stenosis. The circumflex marginal branch is  somewhat small in caliber and has a 40-50% stenosis in its proximal portion.  The right coronary artery is a dominant vessel and is heavily calcified.  There are two previous stents noted in the proximal and mid portion of the  vessel. There is a severe 99% stenosis in the mid portion of the first stent  reflecting either in-stent restenosis or subacute thrombosis. The second  stent has a moderate amount of in-stent restenosis with a 50% stenosis  within it and then there is distal 50% stenosis. The distal vessels are  somewhat small and have some distal disease in them. There is collaterals to  the distal right coronary from the left anterior descending. The posterior  descending appears to have some occlusion in it.   LEFT VENTRICULOGRAM: Performed in the 30 degree RAO projection. The aortic  valve is normal. Mitral valve is normal.  The left ventricle is normal in  size. There is inferior hypokinesis noted. The estimated ejection fraction  was mildly reduced at 45-50%.   IMPRESSION:  1.  Significant two-vessel coronary artery disease, probable involving the      intermediate circumflex and right coronary artery.  2.  Abnormal LV function with inferior hypokinesis.   RECOMMENDATIONS:  The patient has severe disease within the stent of the  right coronary artery.  He previously was not willing to take either Ticlid  or Plavix. I do not feel that further percutaneous intervention will be of  benefit here. We will refer the patient for bypass grafting in light of his  continued symptoms.      Georga Hacking, M.D.  Electronically Signed     WST/MEDQ  D:  09/14/2005  T:  09/14/2005  Job:  [id number]   cc:   Nolon Bussing.  Hilts, M.D.  Fax: [phone]

## 2011-01-13 NOTE — Op Note (Signed)
[name], [name] NO.:  [id number]   MEDICAL RECORD NO.:  [id number]          PATIENT TYPE:  INP   LOCATION:  4709                         FACILITY:  [facility]   PHYSICIAN:  Evelene Croon, M.D.     DATE OF BIRTH:  1925/09/18   DATE OF PROCEDURE:  05/25/2006  DATE OF DISCHARGE:                                 OPERATIVE REPORT   PREOPERATIVE DIAGNOSIS:  Large pericardial effusion.   POSTOPERATIVE DIAGNOSIS:  Large pericardial effusion.   OPERATIVE PROCEDURE:  Subxiphoid pericardial window.   ATTENDING SURGEON:  Evelene Croon, M.D.   ASSISTANT:  Jerold Coombe, P.A.-C.   ANESTHESIA:  General endotracheal.   CLINICAL HISTORY:  This patient is a 77 year old gentleman with a history of  coronary disease, status post prior stenting of his right coronary artery in  1996 and 2005, and subsequent cutting balloon angioplasty of the right  coronary artery in January 2007 for in-stent restenosis.  He recently  presented with progressive pedal edema, fatigue, dyspnea on exertion,  dizziness, and poor appetite, and was diuresed by his primary physician,  with improvement in his peripheral edema.  He had a echocardiogram performed  in Dr. Lubertha Basque office on May 21, 2006 that showed a moderately large  pericardial effusion without tamponade.  There was normal left ventricular  function.  He was seen again on May 23, 2006 for followup and reported  a syncopal episode and was noted to have a low blood pressure.  He was  admitted and underwent repeat echocardiogram which showed continued moderate-  to-large pericardial effusion that Dr. Donnie Aho thought may be slightly  larger.  There were no absolute signs of tamponade, but some suggestion of  cardiac compromise.  The patient had a blood pressure of 100/60.  After  review of this study and examination of the patient, it was felt that  subxiphoid pericardial window was indicated to drain the effusion and for  diagnostic purposes.  I discussed the operative procedure of subxiphoid  pericardial window and pericardial biopsy with the patient and his daughter,  including alternatives, benefits, and risks, including bleeding, blood  transfusion, infection, injury to the heart, and recurrence of the effusion.  They understood and agreed to proceed.   OPERATIVE PROCEDURE:  The patient was taken to the operating room and placed  on table in the supine position.  Preoperative intravenous antibiotics were  given.  After induction of general endotracheal anesthesia, a Foley catheter  was placed in the bladder using sterile technique.  Then, the chest and  abdomen were prepped with Betadine soap and solution and draped in the usual  sterile manner.  A time-out was taken, and the proper patient, proper  operative site, and proper operation were confirmed with nursing and  anesthesia staff.  Then, a vertical midline incision was made over the  xiphoid process and carried down through the subcutaneous tissue using  electrocautery.  The fascia was divided in the midline.  The subxiphoid  space was entered, and dissection continued down to the pericardium.  The  pericardium was opened, and 250 cc of  old bloody fluid was drained under  pressure.  Once this drained, blood pressure rose significantly to normal  levels.  Then, electrocautery was used to resect a piece of pericardium for  biopsy.  The fluid was sent for cytology.  Examination of the epicardial  surface showed a shaggy  exudate throughout, consistent with recent  pericarditis.  Then, a 36-French right-angle chest tube was brought through  a separate stab incision and positioned in the pericardial space inferiorly.  The fascia was then reapproximated with interrupted #1 Vicryl sutures.  The  fascia was anesthetized with 5 cc of 0.25% Marcaine.  Then, the subcutaneous  tissue was closed with continuous 2-0 Vicryl, and the skin with a 3-0 Vicryl   subcuticular closure.  The sponge, needles and counts were correct according  to the scrub nurse.  Dry sterile dressing was applied over the incision and  around the chest tube, which was hooked to Pleur-Evac suction.  The patient  remained hemodynamically stable and was transported to the postanesthesia  care unit in satisfactory and stable condition.      Evelene Croon, M.D.  Electronically Signed     BB/MEDQ  D:  05/25/2006  T:  05/27/2006  Job:  [id number]   cc:   [facility]  W. Viann Fish, M.D.

## 2011-01-13 NOTE — Discharge Summary (Signed)
NAMEBREWER, HITCHMAN NO.:  [id number]   MEDICAL RECORD NO.:  [id number]          PATIENT TYPE:  INP   LOCATION:  4707                         FACILITY:  [facility]   PHYSICIAN:  Madaline Savage, M.D.DATE OF BIRTH:  04-Jun-1926   DATE OF ADMISSION:  08/12/2004  DATE OF DISCHARGE:  08/16/2004                                 DISCHARGE SUMMARY   Mr. Shankland is a 77 year old male followed by Dr. Elsie Lincoln with history of  coronary disease.  He had an RCA stent in 1996.  He was cathed in August  2005 and had 60-70% mid RCA and was treated medically.  His EF was normal.  He has been intolerant to statins and had stopped his Zetia on his own.  We  saw him in August and he was doing well.  He was admitted August 12, 2004  with unstable angina.  The patient was admitted to telemetry and started on  IV heparin, nitrates.  Does have an IODINE allergy and he was pretreated  with Solu-Medrol, Benadryl and Pepcid.  Catheterization done on August 12, 2004 by Dr. Jenne Campus revealed a total RCA and 90% after the total occlusion.  There were some left-to-right collaterals.  He underwent two-site  intervention with Cypher stenting to the RCA.  He also had a residual 50%  LAD, 60% ramus intermedius.  He was continued on heparin for 12 hours  postprocedure.  He did rule in with CK of 698 and 99 MBs.  He was  transferred to the floor and ambulated.  We feel he can be discharged  August 16, 2004.   DISCHARGE MEDICATIONS:  1.  Eye drops as taken at home.  2.  Glipizide 20 mg daily.  3.  Avandia 4 mg daily.  4.  Metoprolol 50 mg twice daily.  5.  Lisinopril 40 mg daily.  6.  Nitroglycerine sublingual p.r.n.  7.  Pepcid AC 10 mg daily.  8.  Hytrin 2 mg h.s.  9.  Inspra 25 mg daily.  10. Plavix 75 mg daily.  11. Coated aspirin daily.   LABORATORY DATA:  White count 8.2, hemoglobin 12.2, hematocrit 35.8,  platelets 272, sodium 139, potassium 4.4, BUN 21, creatinine 1.5.  Lipid  profile  shows an HDL of 43, LDL 119, total cholesterol 183, TSH 0.91.  Chest  x-ray:  No acute findings.   DISPOSITION:  The patient is discharged in stable condition.  He will need a  followup BMP as an outpatient.  He did have a right bundle branch block  on  admission and now has some inferior Q-waves.  We considered adding a statin,  but the patient refuses and said he has had problems with all statins in the  past.       LKK/MEDQ  D:  08/16/2004  T:  08/17/2004  Job:  [id number]   cc:   Lillia Carmel, M.D.  [address]  [place]  [place] [address]  Fax: [phone]

## 2011-01-13 NOTE — Op Note (Signed)
[place]  Patient:    [name], [name]                       MRN: [id number] Proc. Date: 11/28/99 Adm. Date:  [id number] Attending:  Twana First                           Operative Report  PREOPERATIVE DIAGNOSIS:  Right ankle degenerative joint disease.  POSTOPERATIVE DIAGNOSIS:  Right ankle degenerative joint disease.  PROCEDURE:  Right ankle ultrasonic arteriogram, followed by arthroscopic debridement and arthroscopically assisted ankle fusion.  SURGEON:  Elana Alm. Thurston Hole, M.D.  ASSISTANT:  Kirstin Adelberger, P.A.  ANESTHESIA:  General.  OPERATIVE TIME:  1 hr 45 min.  COMPLICATIONS:  None.  INDICATIONS FOR PROCEDURE:  [name] is a 77 year old gentleman who has had significantly increasing right ankle pain over the past three to five years, and especially over the past six to nine months; with signs and symptoms consistent with DJD of the ankle.  He is now to undergo arthroscopy, debridement and a fusion.  DESCRIPTION OF PROCEDURE:  [name] is brought to the operating room on November 28, 1999, placed on the operating table in supine position.  After an adequate level of general anesthesia was obtained, his right ankle was examined under anesthesia.  He had dorsiflexion of 2-3 degrees, plantar flexion of 20 degrees, overall alignment was satisfactory.  His ankle was stable ligamentous examination.  Anterolateral and anteromedial portals were carefully injected, as well as the joint, with 0.25% Marcaine with epinephrine.  He received 1 g IV Ancef preoperatively for prophylaxis.  His right foot and leg was prepped using sterile Betadine, and draped using sterile technique.  Originally through an anterior medial portal, the skin was incised normally and then carefully dissecting down to the joint to prevent any injury to the dorsocutaneous nerves.  The arthroscope with the pump tents was placed and through an anterolateral  portal the same incision in the skin was made.  Carefully dissecting down to the joint as well, an arthroscopic was placed.  There was found to be significant synovitis anterior, anterolateral, and anteromedial (which was thoroughly debrided).  He had grade 4 changes throughout the anterior one-half of the talus and tibial plafond.  The posterior one-half still had cartilage remaining on it; a combination of arthroscopic debriders and a 4 mm bur was used to remove this down the subchondral bone.  The medial malleolus and medial talus also had some degenerative grade 2 and grade 3 changes.  The cartilage was totally removed from these areas, as well as the lateral talus and the fibula.  After the entire talus as well as the medial and lateral talus and the talar dome was completely denuded of cartilage down the subchondral bone, as well as the tibial plafond as well and the medial and lateral malleoli (confirmed by fluoroscopy as well as arthroscopy); the ankle was held in a reduced position of approximately 2-3 degrees of dorsiflexion and 2-3 degrees of valgus, while two separate K-wires from the cannulated 7.2 screw set were placed; one from the medial tibia distally obliquely across the joint into the talus, and the other from the fibula obliquely across into the talus under fluoroscopic control.  Both of these were confirmed to be in excellent position.  They were then over-drilled with a 5.2 mm drill and then 7.2 mm screws of appropriate length were  placed, crisscrossing at the joint and securely fixing the ankle joint in an excellent position with opposing bone on the talus and tibial plafond.  After this was done, it was felt that the position of the fusion was satisfactory.  The position of the hardware was excellent as well.  The wounds were irrigated and then closed using 3-0 nylon suture.  Sterile dressings and a short-leg splint was applied.  The patient was awakened and taken  to the recovery room in stable condition.  FOLLOW-UP CARE:  [name] will be followed as an overnight recovery care patient for IV pain control, neurovascular monitoring.  Discharge will be tomorrow on Percocet for pain.  I will see him back in the office in one week for suture removal and follow-up. DD:  11/28/99 TD:  11/29/99 Job: [id number]

## 2011-01-13 NOTE — H&P (Signed)
NAME:  [name], SWANGER                ACCOUNT NO.:  [id number]   MEDICAL RECORD NO.:  [id number]          PATIENT TYPE:  INP   LOCATION:  3105                         FACILITY:  [facility]   PHYSICIAN:  Pramod P. Pearlean Brownie, MD    DATE OF BIRTH:  1926/04/05   DATE OF ADMISSION:  06/13/2006  DATE OF DISCHARGE:                                HISTORY & PHYSICAL   REFERRING PHYSICIAN:  Devoria Albe, M.D.   REASON FOR ADMISSION:  Stroke.   HISTORY OF PRESENT ILLNESS:  Mr. [name] is a 77 year old, Caucasian male who  was last seen normal at around 1800 hours today when his daughter left him  at home while the 6 o'clock news was on.  She returned about an hour later  and found him to be aphasic and not moving his right side, and unable to get  up.  EMS was called, and the patient was brought to [facility] emergency  room and code stroke was paged apparently at 1948 hours, as per ER records.  However, I did not get the page and neither did Dr. Corliss Skains, the  interventionist on call, and some other members of the code stroke team,  probably due to a technical glitch in the system.  However, an hour later at  2051 hours, Dr. Devoria Albe paged me, to find out why the code stroke team had  not responded.  I communicated to her immediately and came and saw the  patient at 2110.  He was clearly aphasic with dense right hemiparesis.  It  was unfortunately just beyond 3 hours from when he was last seen normal and  he did not qualify for IV thrombolysis.  I explained to the patient's  daughter the other possible bone but unproven treatment options included  endovascular treatment with IA t-PA versus clot retrieval with the MERCI  device.  After discussion the family agreed to consider that.  The patient  remains aphasic with right hemiparesis without any improvement.   PAST MEDICAL HISTORY:  1. Ischemic heart disease status post angioplasty.  2. Congestive heart failure.  3. Diabetes.  4. Hypertension.  5.  Dermatitis.  6. Glaucoma.   HOME MEDICATIONS:  Aspirin, Crestor, Lasix, Glipizide, Humulin, iron,  metoprolol.   ALLERGIES:  Shellfish.   PAST SURGICAL HISTORY:  None.   SOCIAL HISTORY:  Lives with daughter in [place].  He quit smoking in  1996.  He does not drink alcohol.  He is independent in activities of daily  living.   REVIEW OF SYSTEMS:  Not significant for any fever, chest pain, cough,  diarrhea or other illness.   PHYSICAL EXAMINATION:  GENERAL:  An obese, elderly, Caucasian male who is  not in distress.  VITAL SIGNS:  Afebrile, pulse rate 79, regular sinus, blood pressure 123/65.  Respiratory rate 21.  Saturation 100% on room air.  Distal pulses are well  felt.  HEENT:  Atraumatic.  Neck is supple without bruit.  ENT exam unremarkable.  CARDIAC:  Regular heart sounds.  LUNGS:  Clear to auscultation.  ABDOMEN:  Soft, nontender.  NEUROLOGIC:  On  examination the patient is awake, alert.  He is globally  aphasic.  He does not follow any commands.  He does speak a few words which  are difficult to comprehend.  He has no fixed gaze deviation.  He does  follow fingers horizontally in both directions.  He does not blink to threat  on the right, and has dense homonymous hemianopsia.  He has significant  right lower facial weakness.  He has mild right upper extremity drift.  He  can move the upper extremity.  However, he is quite plegic and flaccid in  the right lower extremity.  It falls limp to the ground when held up.  He  has decreased perception to touch and pinprick on the right side.  Coordination cannot be tested reliably.  Plantar on right is equivocal, left  is downgoing.  On the NIH stroke scale he scored 18.  Modified Rankin score  is 1.   DIAGNOSTIC DATA:  CT scan of the head noncontrast study reveals no acute  abnormality, no hemorrhage or hyperdense MCA sign is seen.  Admission labs  show electrolytes are normal.  BUN is 26, creatinine 1.6, calcium 8.1.   Liver enzymes are normal.  WBC count is normal.  Hemoglobin is  10.6,  hematocrit 33.2.  Platelet count is normal.  Coagulation labs are normal.  INR is 1.3.   IMPRESSION:  A 77-year gentleman with sudden onset of aphasia and right  hemiparesis likely due to a large left hemispheric infarct.  This probably  looks like a terminal ICA occlusion versus proximal M1 occlusion.  The  patient did present within 3 hours of onset of symptoms, but unfortunately  is out of the time window for thrombolysis at present.  He may however  benefit with consideration for endovascular clot extraction using intra-  arterial t-PA or MERCI device.  I explained to the patient's daughters as to  why t-PA was not considered even though the patient arrived within 3 hours,  likely due to technical glitch with the paging system.  The family has  agreed.  Will consult Dr. Corliss Skains, interventional radiologist, to do  cerebral angiogram to see if the patient qualifies for endovascular clot  extraction.  The patient is a significant risk for worsening morbidity and  mortality and is critically ill.  He will be admitted to the neurological  intensive care unit.  Blood pressure will be strictly controlled.  He will  subsequently have an MRI scan to image the size of the stroke as well as get  echocardiogram, Doppler studies and stroke risk stratification labs.  Physical, occupational, speech therapy and rehabilitation versus SNF for  disposition.  The patient is critically ill.  I spent to 2 hours at the  patient's bedside directing his care as well as for coordination of is care,  and discussion of his care with other physicians and multiple conversations  with family members.           ______________________________  Sunny Schlein. Pearlean Brownie, MD     PPS/MEDQ  D:  06/13/2006  T:  06/15/2006  Job:  [id number]   cc:   Georga Hacking, M.D.  Lillia Carmel, M.D.

## 2011-01-13 NOTE — Discharge Summary (Signed)
NAMECASHTON, [name] NO.:  [id number]   MEDICAL RECORD NO.:  [id number]          PATIENT TYPE:  OIB   LOCATION:  6526                         FACILITY:  [facility]   PHYSICIAN:  Georga Hacking, M.D.DATE OF BIRTH:  Sep 28, 1925   DATE OF ADMISSION:  09/18/2005  DATE OF DISCHARGE:  09/19/2005                                 DISCHARGE SUMMARY   FINAL DIAGNOSES:  1.  Unstable angina pectoris to a. Status post cutting balloon angioplasty      of the of in-stent restenosis involving the right coronary artery.  2.  Hyperlipidemia.  3.  Hypertension.  4.  Diabetes mellitus.   PROCEDURE:  Cutting balloon angioplasty of the right coronary artery.   HISTORY OF PRESENT ILLNESS:  This 77 year old male has had recurrent chest  discomfort following a previous inferior infarction and stenting of the  right artery with Cipher stents one  year ago by Dr. Lenise Herald of  [place].  He had changed care to me.  He had had a chronic skin rash  previously but felt the skin rash had worsened on Plavix and had stopped  taking his Plavix early despite having a drug-eluting stent.  Despite  attempts to manage the rash we changed him to Ticlid.  He refused to take  dihydropyridine therapy.  He developed recurrence of angina last summer and  then developed unstable angina in the past week prior to admission.  He had  a outpatient catheterization showing a focal in-stent restenosis involving  the right coronary stent proximally.  After consideration of options of  bypass grafting versus percutaneous therapy it was felt due to the  difficulty taking dihydropyridine he would be a poor candidate for recurrent  stenting.  We thought, however, that we would try cutting balloon  angioplasty since the stenosis was more focal thinking that he might be able  to have a good result since it was a focal stenosis.  He understood the  risks associated this as well as restenosis and very much wished  to try this  method of therapy.  Please see the typed history and physical that is  already in the chart for the remainder of the details.   HOSPITAL COURSE:  He was admitted to the outpatient catheterization center.  He was taken to the cath lab and angioplasty was done with a 3.0x12x10 mm  cutting balloon of the right coronary with a excellent result with less than  10% residual stenosis.  He followed the procedure well and had a negative  CPK and enzymes following the procedure.  He was ambulatory the next day  without pain and his cath site was fine.  He is discharged at this time in  improved condition on Lantus insulin 30 units subcu a.m., Glipizide 10 mg  b.i.d., felodipine 5 mg daily, Plavix 75 mg daily, aspirin 325 mg daily,  Toprol XL 50 mg daily, lisinopril 40 mg daily, Terazosin 4 mg at bedtime.  He is also to resume his normal eye drops.  Use nitroglycerin p.r.n.   DISCHARGE INSTRUCTIONS:  He is to follow-up with  me in one week.  At some  point we would like him to try to reinstitute cholesterol therapy perhaps  every other day or weekly in a low-dose fashion to control hyperlipidemia.  We also asked him to follow a carbohydrate-restricted diet in order to lose  weight.  He is to follow-up in one week for follow-up and we have asked him  to become involved in cardiovascular rehab.      Georga Hacking, M.D.  Electronically Signed     WST/MEDQ  D:  09/19/2005  T:  09/19/2005  Job:  [id number]

## 2011-01-13 NOTE — Discharge Summary (Signed)
NAMEABEL, HAGEMAN NO.:  [id number]   MEDICAL RECORD NO.:  [id number]          PATIENT TYPE:  INP   LOCATION:  3029                         FACILITY:  [facility]   PHYSICIAN:  Bevelyn Buckles. Champey, M.D.DATE OF BIRTH:  22-Dec-1925   DATE OF ADMISSION:  06/13/2006  DATE OF DISCHARGE:  06/21/2006                                 DISCHARGE SUMMARY   DISCHARGE DIAGNOSES:  1. Left middle cerebral artery infarct, thrombotic.  2. Congestive heart failure.  3. Diabetes.  4. Hypertension.  5. Ischemic heart disease.  6. Right arm pain.  7. Dermatitis, etiology unknown.  8. Glaucoma.  9. Dyslipidemia.   DISCHARGE MEDICATIONS:  1. Metoprolol 25 mg b.i.d.  2. Albuterol 2.5 mg inhaler q.6h.  3. Sliding scale insulin.  4. Ferrous sulfate 300 mg a day.  5. Travatan eye drops right eye at bedtime.  6. Alphagan one drop to right eye b.i.d.  7. NPH insulin 14 units before breakfast and 12 units every evening.  8. Crestor 20 mg a day.  9. Jevity at 60 mL an hour.  10.Atrovent 0.50 mg q.6h. inhaler.  11.Aspirin 300 mg a day.  12.Protonix 40 mg a day.  13.Triamcinolone cream once a day.  14.Lovenox 40 mg subcu a day.  15.Aggrenox one a day x14 days then decrease to b.i.d.   STUDIES PERFORMED:  1. CT of the brain on admission shows no acute abnormalities.  A 48-hour      follow-up showed expected evolutionary changes of left MCA infarct      without hemorrhagic transformation.  2. MRI of the brain shows a large area of acute left MCA territory infarct      without hemorrhage.  3. MRA of the brain shows wide patency in reestablished left MCA      territory.  4. Multiple chest x-rays verifying tracheal tube placement.  5. EKG shows normal sinus rhythm with a right bundle branch block.  6. Transthoracic echocardiogram showed EF of 50-60% with no left      ventricular regional wall motion abnormalities, no embolic source.  7. Carotid Doppler shows right marked plaque proximal  ICA, fairly      pulsatile ICA wave form left minimal plaque in the ICA, bilateral      vertebral flow antegrade.  8. Cerebral angiogram shows left MCA occlusion status post Mercy device x3      plus 25 mg intra-arterial TPA and left MCA PTCA with good      recannulization.  9. Postprocedure CT with mild increased perfusion, left basal ganglia.   LABORATORY STUDIES:  CBC with hemoglobin range 8.7-10.6, hematocrit 27.3-  33.2, white blood cells 8.2, MCV 73.7, MCHC 32.7, RDW 16.9, platelets 346.  Differential with 12 monocytes, 6 eosinophils, 0 basophils, 64 neutrophils  and 17 lymphocytes, on admission was 1.3.  Chemistry with sodium in the 134-  143, glucose 170-231, calcium 7.3-8.2, albumin 2.7 and 3.  Liver function  tests normal.  Hemoglobin A1c 7.6.  Homocystine 12.  Cholesterol 86,  triglycerides 41, HDL 29 and LDL 49.  Urine drug screen was negative.  Urinalysis was cloudy but otherwise negative.  Cardiac enzymes were  negative.   HISTORY OF PRESENT ILLNESS:  Mr. [name] is a 77 year old white male  who was last seen normal at 6 p.m. the day of admission when his daughter  left him at home.  She returned about an hour later and found him to be  aphasic and not moving his right side and unable to get up.  EMS was called  and patient was transferred to [facility] Emergency  Room and a code stroke was called.  The physician on call did not get the  page as well as other members of the stroke team, likely due to technical  glitch in the system.  An hour after arrival, EDP physician paged on-call  physician to see why the code stroke team had not responded.  The patient  was seen by the stroke physician at 9:10 p.m..  He was clearly aphasic with  dense right hemiparesis.  He was unfortunately beyond the 3 hours when he  was last seen normal and did not qualify for IV TPA.  Other treatment  options were discussed with the daughter and it was decided to  perform  cerebral angiogram with an arterial TPA and devices as necessary.  After  discussion with the family, that was instituted.  During the cerebral  angiogram, the patient was found to have some left MCA occlusion and Mercy  retrieval device was attempted x3 without success and 25 mg of IA intra-  arterial TPA was given an balloon angioplasty was performed on left MCA with  good recannulization.  A CT showed no hemorrhage.  He was admitted to the  ICU for further stroke care.   HOSPITAL COURSE:  Critical care medicine was consulted for ventilator  management.  He was easily extubated.  He had some dysphagia post extubation  and tube feeds were started via Panda.  Extra doses of Lasix had to be given  for congestive heart failure of which he does have a history.  Poorly  controlled diabetes was addressed in the ICU with insulin.  He has  longstanding history of dermatitis which was evaluated upon arrival but  seemed not to worsen during hospital stay.  He eventually was able to  tolerate a swallow study and was recommended to have a D2 thin liquid diet.  PT and OT evaluated the patient and felt he would benefit from inpatient  rehab.  The patient has vascular risk factors of diabetes, hypertension,  ischemic heart disease, obesity and dyslipidemia.   CONDITION ON DISCHARGE:  The patient alert and oriented x3.  Speech slurred  with aggressive and receptive aphasia.  He does not follow commands.  He is  able to verbalize swear words and occasional social language, otherwise  nothing.  He has right facial weakness, right homonomous hemianopsia.  His  chest is clear to auscultation.  Heart rate is regular but tachycardic.  He  has right upper extremity hematemesis and significant edema.  He has pain in  the movement of his right arm.  his right lower extremity is 3-4/5 and his  left strength is okay.   DISCHARGE/PLAN: 1. Transfer to rehab for continuation of PT, OT and speech  therapy.  2. X-ray right arm and had rehab follow up.  3. Add Neurontin for pain in right in the case is neuropathic.  4. Aggrenox for secondary stroke prevention.  5. Follow up with primary care physician at discharge  within 1 month.  6. Follow up with Dr. Pearlean Brownie in 2-3 months.      Annie Main, N.P.      Bevelyn Buckles. Nash Shearer, M.D.  Electronically Signed    SB/MEDQ  D:  06/26/2006  T:  06/26/2006  Job:  [id number]   cc:   Georga Hacking, M.D.  Annia Friendly. Loleta Chance, MD

## 2011-01-13 NOTE — Assessment & Plan Note (Signed)
The patient was seen last on July 08, 2006, when he was discharged  from [facility] inpatient rehab.  He was admitted originally to [facility] on June 13, 2006, with aphasia and right-sided weakness.  An  initial CT showed no acute findings.  He had a revascularization  procedure per Dr. Corliss Skains.  MRI showed a large acute left MCA  distribution with reestablished patency of the left MCA territory.  While in inpatient rehab his ambulation recovered quite nicely, but he  continued to have right upper extremity weakness and particularly a  receptive greater than expected aphagia.   His diabetes and hypertension have been managed as well as his  dyslipidemia, and he has followed up with Dr. Prince Rome as well as Dr.  Donnie Aho in the interval time.  No medication changes have been made.  He  plans to follow up with Dr. Pearlean Brownie Thursday December 20 for neurology.   He has no significant pain complaints other than a mild pain in his  right wrist.  He had more significant pain prior to a wrist injection  per Dr. Prince Rome.  His sleep is fair.  He can walk without assistance about  15 minutes at a time.  He can dress and bathe himself.   REVIEW OF SYSTEMS:  Cannot be obtained from him, but his family did note  some confusion.   His gait shows no evidence of toe drag or knee instability.  His left  upper extremity has full strength.  His right upper extremity is  difficult to muscle test, he appears to be apraxic, but generally has 3-  /5 strength in the deltoid, biceps, triceps, grip.  He uses a soft wrist  orthosis on the right side.  His speech, he has difficulty naming.  He is fluent, but uses jargon.  He follows gestural cues.   IMPRESSION:  Left middle cerebral artery infarction resulting primarily  in right upper extremity weakness as well as a receptive greater than  expressive aphasia.  He does have some balance deficits, and had a near  fall yesterday but caught by family.   PLAN:   Given that he is no longer receiving home health therapy as of  the last week, we will send him to [facility] outpatient therapy with  PT, OT and speech evals.  I have written for 2 to 3 times per week x6  weeks, and I will see the patient back in 8 weeks.  He will follow up  with neurology as well as primary care and cardiology as needed.      [name], M.D.  Electronically Signed     AEK/MedQ  D:  08/09/2006 16:20:42  T:  08/10/2006 03:44:27  Job #:  [id number]   cc:   Pramod P. Pearlean Brownie, MD  Fax: [phone]   Lillia Carmel, M.D.  Fax: [phone]   W. Viann Fish, M.D.  Fax: [phone]  Email: [email or web address]

## 2011-01-13 NOTE — Discharge Summary (Signed)
NAMEMARCELLO, [name] NO.:  [id number]   MEDICAL RECORD NO.:  [id number]          PATIENT TYPE:  INP   LOCATION:  4705                         FACILITY:  [facility]   PHYSICIAN:  Georga Hacking, M.D.DATE OF BIRTH:  1926/06/09   DATE OF ADMISSION:  05/23/2006  DATE OF DISCHARGE:  05/29/2006                                 DISCHARGE SUMMARY   FINAL DIAGNOSES:  1. Pericardial effusion with early tamponade.      a.     Status post pericardial window.      b.     Pathology showing no malignancy and just inflammation.  2. Acute renal failure likely due to hypotension and pericardial effusion.  3. Coronary artery disease with previous in-stent restenosis the right      coronary artery and previous inferior infarction.  4. Diabetes mellitus insulin-dependent.  5. Hypertension.  6. Hyperlipidemia.  7. Chronic skin rash.   PROCEDURES:  Echocardiogram, pericardial window, and drainage perfusion by  Dr. Evelene Croon.   HISTORY OF PRESENT ILLNESS:  The patient is a 77 year old male with a  previous history of coronary artery disease. He had cutting balloon  angioplasty for in-stent restenosis in January. He also has mild chronic  renal insufficiency. He has had a chronic skin rash and had progressive  pedal edema where his legs would drain at night and weep. He did not have  angina but noted progressive fatigue and dyspnea on exertion. He is treated  with Lasix by Dr. Prince Rome and showed some improvement in his lower extremity  edema. He developed worsening renal insufficiency, and BNP was really not  that elevated. Chest x-ray showed a stable left upper lobe scar with normal  heart size and no heart failure. Echocardiogram in the office early in the  week showed a moderately large pericardial effusion but no definite  tamponade. He also was found to be moderately anemic. He, however, had a  syncopal episode and presented to the office with hypotension with a  pressure of  70/50. He was admitted to the hospital for further evaluation.  Please see previously dictated History and Physical for remainder of the  details.   HOSPITAL COURSE:  On admission the patient was found to be anemic with a  hemoglobin 10 and hematocrit 30. This anemia was stable. Sed rate was 36.  Protime was 17 seconds with an INR 1.4, PTT 43. Sodium  136, potassium 6.2  on admission, chloride 104, CO2 21, glucose 159, BUN 99, creatinine 5.2.  Calcium 8.2. Albumin 2.9. Hemoglobin A1c 7.8. Brain natriuretic peptide 187.  TSH was 2.045. Iron was 19 with TIBC 248. Percent saturation 8%.   Chest x-ray showed clear lungs. EKG showed sinus rhythm and somewhat low  voltage.   The patient was admitted to the hospital and had intravenous fluids. Because  of his elevated potassium, a repeat potassium was obtained that was 4.8. He  had some nausea that resolved, but his neck veins were noted to be up.  Repeat echo showed some very early right atrial collapse and a large  effusion. The etiology  of the effusion was uncertain. He was seen in  consultation by  Dr. Marina Gravel who felt he had acute renal failure and wondered if the  pericardial effusion could be playing some role in it. After discussion and  review of the echo again, Dr. Evelene Croon was asked to see the patient and  agreed, and the patient underwent a pericardial window on May 25, 2006  by Dr. Evelene Croon with findings of 250 cc of bloody pericardial fluid sent  for cytology and pericardial biopsies and pathology. There is shaggy exudate  over the heart consistent with recent pericarditis. He tolerated this well.  His blood pressure came up following this. His creatinine had dropped to 3.4  by that day and continued to improve over the weekend with hydration. He  gradually improved, and his medicines were restarted, although his  lisinopril was held. His beta blockers were restarted. By May 29, 2006 he  was on the telemetry  floor. Was ambulatory and was stable without shortness  of breath. His hemodynamics had improved. His creatinine had normalized at  1.8, and BUN had fallen back to 18.  He had some mild hypoglycemia the day  of discharge, and we felt this was due possibly to sliding scale insulin  which was discontinued. He is discharged at this time in improved condition.   DISCHARGE MEDICATIONS:  1. Aspirin 81 mg daily.  2. Lantus insulin 30 units daily.  3. Glipizide 10 mg b.i.d.  4. Metoprolol 50 mg b.i.d.  5. Iron sulfate 300 mg b.i.d.  6. Crestor 20 mg weekly.   He was given activity limitations and is to walk daily. He is to follow up  in the office in one week and is to discontinue lisinopril and terazosin. He  eventually will need to have his anemia workup done with Dr. Matthias Hughs for  possible iron deficiency anemia.      Georga Hacking, M.D.  Electronically Signed     WST/MEDQ  D:  05/29/2006  T:  05/30/2006  Job:  [id number]   cc:   Evelene Croon, M.D.  Lillia Carmel, M.D.

## 2011-01-13 NOTE — Cardiovascular Report (Signed)
[name], Harding NO.:  [id number]   MEDICAL RECORD NO.:  [id number]          PATIENT TYPE:  INP   LOCATION:  2931                         FACILITY:  [facility]   PHYSICIAN:  Darlin Priestly, MD  DATE OF BIRTH:  05-27-1926   DATE OF PROCEDURE:  08/12/2004  DATE OF DISCHARGE:                              CARDIAC CATHETERIZATION   PROCEDURES:  1.  Left heart catheterization.  2.  Coronary angiography.  3.  Left ventriculogram.  4.  Right coronary artery-mid.      1.  Percutaneous transluminal coronary angioplasty - placement of          intracoronary stent.  5.  PDA-mid.      1.  Percutaneous transluminal coronary angioplasty.   ATTENDING PHYSICIAN:  Darlin Priestly, M.D.   COMPLICATIONS:  None.   INDICATIONS:  Mr. [name] is a 77 year old male patient of Dr. Lavonne Chick  and Dr. Lavada Mesi with history of noninsulin-dependent diabetes  mellitus, hypertension, history of RCA stent in May 1996 who presents to the  emergency room on the morning of August 12, 2004 with one hour of  substernal chest pain.  Subsequent EKG revealed acute inferior ST elevation.  He is now brought emergently to the cardiac catheterization lab for  percutaneous intervention.   DESCRIPTION OF OPERATION:  After obtaining informed written consent, the  patient was brought to the cardiac catheterization laboratory.  Right and  left groin were  shaved and prepped and draped in the usual sterile fashion.  ECG monitor was established.  Using the modified Seldinger technique, a 7  French arterial sheath was inserted in the right femoral artery. Next, a 6  Jamaica JL-4 diagnostic catheter was then used to obtain left coronary  angiogram.   The left main is a large vessel with no significant disease.   The LAD is a large vessel which coursed the apex and gives rise to one  diagonal branch.  There is calcification noted in the early mid portion of  the LAD at the bifurcation of the  first diagonal.  There is approximately 50-  60% lesion in the early mid LAD involving the first diagonal.   The first diagonal is a medium size vessel with mild ostial disease, but no  high grade stenosis.   Left coronary artery gives rise to a medium ramus intermedius which  bifurcates in the distal segment.  There is a 60% proximal ramus disease.   Left circumflex is a medium size vessel which courses in the AV groove and  gives rise to one obtuse marginal branch.  The AV groove circumflex has no  significant disease.   The first OM is a medium size vessel which bifurcates distally with 30%  mid  vessel stenosis.   The right coronary artery is a large vessel which is noted to have a stent  in its proximal portion and is totally occluded.  There are faint left-to-  right collaterals from the distal circumflex.   LEFT VENTRICULOGRAM:  Left ventriculogram reveals low normal EF at  approximately 50% with inferior and posterior  basal hypokinesis.   HEMODYNAMICS:  Systemic arterial pressure 120/56, LV pressure 120/9, LVEDP  of 14.   INTERVENTIONAL PROCEDURE:  RCA-mid.  Following diagnostic angiography, a 7  Jamaica JR-4 guiding catheter with side holes was then engaged in the right  coronary ostium and selective angiogram performed.  Next, a 0.014 ATW marker  wire was advanced out of the guiding catheter and used the cross the mid RCA  stenotic lesion and positioned in the distal PLA without difficulty.  Next,  a Maverick 2.5 x 15-mm balloon was then used to track into the mid portion  of the RCA and two inflations to maximum of 8 atmospheres for total of  approximately 1 minute was performed.  Followup angiogram revealed TIMI-2-3  flow in the distal RCA as well as PD and posterior lateral branch with  moderate amount of thrombus burden in the mid portion of the RCA.  This  balloon was then removed and a Medtronic Export catheter was then advanced  into the mid portion of the RCA and  one pullback was then performed from the  Export catheter.  There was no significant change in the thrombus burden  following the Export catheter.  This catheter was then removed, and a Cypher  3.0 x 28-mm stent was then positioned in the distal RCA stenotic lesion.  It  should be noted that once the vessel was opened there was tandem 90, 70, and  50% lesions in the distal RCA with distal calcification.  This stent was  then placed across the 70% stenotic lesion and brought back to the mid  portion of the RCA.  This stent was then deployed to a maximum of 8  atmospheres for 36 seconds.  A second inflation to 14 atmospheres was  performed for 27 seconds.  Followup angiogram revealed no evidence of  dissection or thrombus with TIMI-3 flow to the distal vessel.  This stent  was then removed and a second Cypher 3.0 x 33-mm stent was then positioned  across the mid and proximal RCA.  This stent was then deployed to 10  atmospheres for a total of 33 seconds.  A second inflation to 16 atmospheres  was performed for 32 seconds.  Followup angiogram revealed no evidence of  dissection or thrombus with TIMI-3 flow to the distal vessel.  There was  noted to be an 80% mid PDA lesion and the PLA wire was then pulled back into  the distal RCA and the PDA was then easily wired.  The Maverick 2.5 x 15-mm  balloon was then tracked across the mid PDA stenotic lesion and three  inflations to a maximum of 8 atmospheres for a total of approximately 2  minutes and 25 seconds was then performed.  Followup angiogram revealed less  than 20% residual stenosis with TIMI-3 flow to the distal vessel.  IV ReoPro  was used throughout the case.  Intravenous doses of heparin were given to  maintain the ACT between 200 and 300.   Final orthogonal angiograms revealed less than 20% residual stenosis in the  mid PDA stenotic lesion with less than 10% residual stenosis throughout the mid and proximal RCA.  At this point we  elected to conclude the procedure.  All balloons, wires, and catheters removed.  Hemostatic sheath was sewn in  place.  The patient was transferred back to the ward in stable condition.   CONCLUSION:  1.  Successful percutaneous transluminal coronary balloon angioplasty of the  mid posterior descending artery lesion.  2.  Successful percutaneous transluminal coronary balloon angioplasty      placement of two intracoronary stents      (Cypher 3.0 x 28, 3.0 x 13) in the distal and mid right coronary artery      stenotic lesions respectively.  3.  Low normal ejection fraction with wall motion abnormalities noted above.  4.  Adjunct use of Reopro infusion.      Robe   RHM/MEDQ  D:  08/12/2004  T:  08/13/2004  Job:  [id number]   cc:   Lillia Carmel, M.D.  [address] [place]  [place] [address]  Fax: [phone]

## 2011-01-13 NOTE — Cardiovascular Report (Signed)
NAME:  [name], [name] NO.:  [id number]   MEDICAL RECORD NO.:  [id number]          PATIENT TYPE:  OIB   LOCATION:  6526                         FACILITY:  [facility]   PHYSICIAN:  Georga Hacking, M.D.DATE OF BIRTH:  [birth year]   DATE OF PROCEDURE:  09/18/2005  DATE OF DISCHARGE:                              CARDIAC CATHETERIZATION   HISTORY OF PRESENT ILLNESS:  This 77 year old male with previous diabetes  and inferior infarction who has had previous Cipher stenting of the right  coronary artery.  He presented with worsening chest discomfort and had a  severe focal in-stent restenosis in the proximal portion of the Cipher  stent.  He had previously had difficulty adhering to Plavix therapy as well  as Ticlid therapy because of a suspected rash although this was not felt to  be necessarily due to the Plavix.  After considerable discussion it was  elected to try cutting balloon angioplasty of this artery as he was not felt  to be a great candidate for coronary bypass grafting due to the lack of  significant LAD disease as well as somewhat small caliber vessels, age and  diabetes.  He actually very much wishes to try this as opposed to the other  options.   PROCEDURE:  Cutting balloon angioplasty of the right coronary artery for in-  stent restenosis.   DESCRIPTION OF PROCEDURE:  The patient was brought from the outpatient  holding area to the cath lab and was prepped and draped in the usual manner.  After Xylocaine and anesthesia a 7-French sheath was placed a right femoral  artery percutaneously with moderate difficulty in locating the artery.  Angiograms were made of the right coronary artery using a 7-French JR-4  guiding catheter.  Angiomax was begun with bolus and infusion resulting in  an ACT of 352.  Nitroglycerin was begun later on the procedure.  An HTF-J  guidewire was advanced through the lesion without difficulty and four  inflations were made with the  cutting balloon 3.0x10 mm to 8-9 atmospheres.  He had mild discomfort with inflations.  Intracoronary nitroglycerin 200 mcg  was given and post dilatation angiograms were obtained.  The system was  removed and the sheath sutured in place and he was returned to the  angioplasty care center in stable condition.   ANGIOGRAPHIC DATA:  Right coronary predilatation shows a severe focal  somewhat eccentric stenosis estimated at 95% of the proximal right coronary  artery within the previously placed stents.  The posterior descending artery  is occluded.  Post dilatation angiograms reveal less than 10% residual  stenosis and actually 0% residual stenosis with TIMI grade 3 flow.  There is  calcification at 50% stenosis distally in the vessel that was not addressed.   IMPRESSION:  Successful cutting balloon angioplasty of the right coronary  with stenosis going from 95% to 0%.   RECOMMENDATIONS:  We will attempt to try the patient on Plavix again, try  lipid lowering therapy again.  We will call him and he will be watched  overnight and hopefully discharged in the morning.  Georga Hacking, M.D.  Electronically Signed     WST/MEDQ  D:  09/18/2005  T:  09/19/2005  Job:  [id number]   cc:   Lillia Carmel, M.D.  Fax: [phone]

## 2011-01-13 NOTE — H&P (Signed)
NAME:  [name], [name] NO.:  [id number]   MEDICAL RECORD NO.:  [id number]          PATIENT TYPE:  IPS   LOCATION:  NA                           FACILITY:  [facility]   PHYSICIAN:  Ellwood Dense, M.D.   DATE OF BIRTH:  [birth year]   DATE OF ADMISSION:  DATE OF DISCHARGE:                                HISTORY & PHYSICAL   ADMISSION NOTE   PRIMARY CARE PHYSICIAN:  Dr. Nolon Bussing. Hilts.  NEUROLOGIST:  Dr. Janalyn Shy P. Sethi.  CARDIOLOGIST:  Dr. Georga Hacking.  DERMATOLOGIST:  Dr. Nicholas Lose.   HISTORY OF PRESENT ILLNESS:  Mr. [name] is a 77 year old adult male with  history of diabetes mellitus and hypertension.  He is admitted 06/13/2006  with acute onset aphagia and right sided weakness.  The initial cranial CT  showed no acute findings.   The patient underwent cerebral angiogram along with Merci clot retrieval and  reperfusion performed by Dr. Corliss Skains.  Carotid Dopplers showed moderate  right proximal internal carotid artery plaque.  MRI and MRA study of the  brain showed a large, acute left middle cerebral artery infarct which was re-  established including patency of the left middle cerebral artery territory  after the Merci procedure.  The patient was ventilatory dependent for a  short period of time and was eventually extubated June 15, 2006.  Modified barium swallow was done and the patient had difficulty maintaining  adequate mastication.  He was a D2 diet with thin liquids.   The patient continues with receptive and expressive aphagia with some  question questionable apraxia.  He has limited spontaneous utterances which  are neologisms and occasionally repetitive words.  His right upper extremity  is plegic and he has complaints of right wrist pain.  X-rays have been done  and show advanced radiocarpal and distal ulnar degenerative joint disease  with evidence of carpal instability and chronic deformity of the scaphoid  and lunate bones.   The patient  was evaluated by the rehabilitation physicians and felt to be an  appropriate candidate for inpatient rehabilitation.   REVIEW OF SYMPTOMS:  Positive for rash, joint swelling of the right wrist,  weakness, insomnia and anxiety.   PAST MEDICAL HISTORY:  1. Diabetes mellitus.  2. Coronary artery disease with prior PTCA x 2.  3. Congestive heart failure.  4. Dermatitis approximately 2 years ago which is resolving.  5. Glaucoma.  6. Right ankle upper reduction and internal fixation.  7. Degenerative joint disease.  8. Questionable sleep apnea.  9. Questionable anemia.  10.Strabismus of the left eye.  11.Recent pericardial effusion with window done May 25, 2006.   FAMILY HISTORY:  Noncontributory.   SOCIAL HISTORY:  The patient lives in a 1 level home with his daughter who  works days.  The patient does not work outside the home.  There are 3 steps  to enter the home.  The patient does not use alcohol or tobacco.   FUNCTIONAL HISTORY PRIOR TO ADMISSION:  Independent and driving prior to  admission.   ALLERGIES:  Plavix, Norvasc, Zocor, Lantus, shellfish.  MEDICATIONS:  Prior to admission:  1. Glipizide 1 mg b.i.d.  2. Metoprolol 25 mg b.i.d.  3. Crestor 20 mg.  4. Iron 325 mg.  5. NPH insulin 6 units every a.m. and 4 units every h.s.  6. Travatan eye drops every h.s.  7. Alphagan eye drops b.i.d..   LABORATORY:  Recent labs June 20, 2006 showed hemoglobin 10.2, hematocrit  30.4, platelet count of 346,000, white count 11.8.  Recent sodium was 134,  potassium 3.9, chloride 106, CO2 24, BUN 4, creatinine 0.7, glucose 92.  Recent SGPT was 46 with albumin of 1.7 and total protein of 5.6.  Urine drug  screen was negative.  Hemoglobin A1C was 7.6, cholesterol 86, HDL  cholesterol 29, LDL cholesterol 49 and triglycerides 41.   PHYSICAL EXAMINATION:  VITAL SIGNS:  Not yet obtained.  GENERAL:  The patient is a reasonably healthy appearing 77 year old male,  lying in bed  in no acute discomfort unless his right upper extremity is  touched.  He has significant word finding difficulties and has fluid  aphagia.  HEENT:  Normocephalic, nontraumatic.  CARDIOVASCULAR:  Regular rate and rhythm.  S1, S2, without murmurs.  ABDOMEN:  Soft, nontender with positive bowel sounds.  LUNGS:  Clear to auscultation bilaterally.  NEUROLOGIC:  Alert and oriented x questionably 1-2 with occasional cues.  He  was unable to follow simple commands involving his left upper extremity.  He  had spontaneous speech but the expression was significantly limited.  He had  significant comprehension problems.  Upper extremity exam on the left showed 5/5 strength throughout.  Bulk and  tone were normal.  Reflexes were 2+ and symmetrical.  Upper extremity on the right was limited secondary to complaints of right  wrist pain demonstrated but no expressed.  He would not let anyone touch the  right wrist but was willing to let people move his right elbow and right  shoulder.  He has 3-/5 strength throughout the right upper extremity.  Bilateral lower extremity exam showed 5-/5 strength throughout.  Bulk and  tone were normal.  Reflexes were 2+ and symmetrical.  Sensation was intact  to light touch throughout the bilateral lower extremities.   IMPRESSION:  1. Status post left middle cerebral infarct with significant right upper      extremity paresis along with aphagia and dysphagia.  2. Status post Merci clot retrieval with poor reperfusion of left middle      cerebral artery territory.  3. Non insulin dependent diabetes mellitus.  4. Hypertension.  5. Dyslipidemia.  6. Status post recent pericardial effusion with window late September 2007      for congestive heart failure.  7. Currently the patient has deficits in activities of daily living,      transfers, ambulation, cognition and speech both expressive and      receptive.   PLAN: 1. Admit to rehabilitation unit for daily  occupational therapy, physical      therapy and speech therapies.  2. Advance activities of daily living, transfers, ambulation, speech and      swallowing as able.  3. Check admission labs including CBC and C-MET Friday, June 22, 2006.  4. Continue monitoring hypertension on Metoprolol 25 mg by mouth b.i.d..  5. Continue Neurontin 100 mg by mouth h.s. along with addition of Lidoderm      patch to the right hand, on 12 hours and off 12 hours daily.  6. Monitor anemia with continuation of iron sulfate 225 mg daily.  7. Subcu Lovenox 40 mg subcu daily for deep vein thrombosis prophylaxis.  8. Continue Alphagan 0.2% to the right eye b.i.d. and Travatan 0.004%, 1      drop into the right eye every h.s.  9. Monitor CBGs on NPH insulin 15 units subcu b.i.d.Marland Kitchen  10.Continue Protonix 40 mg daily for GI prophylaxis.  11.Continue Aggrenox 1 by mouth daily through July 03, 2006, then      increase to 1 by mouth b.i.d..  12.Continue aspirin by mouth daily, enteric coated.  13.Communication board in room.  14.Senokot-S, 2 tablets by mouth every h.s.  15.Atarax 25 mg by mouth t.i.d. as needed for itching.  16.Elevate right upper extremity on 2 pillows and obtain resting wrist      splint for carpal/radial instability.   PROGNOSIS:  Fair.   ESTIMATED LENGTH OF STAY:  15-25 days.   GOALS:  Min assist to supervision for safety secondary to speech  difficulties with other activities of daily living and mobility at modified  independent level to supervision.           ______________________________  Ellwood Dense, M.D.     DC/MEDQ  D:  06/21/2006  T:  06/21/2006  Job:  [id number]

## 2011-01-13 NOTE — Op Note (Signed)
[facility]  Patient:    [name], [name]                         MRN: [id number] Proc. Date: 03/06/01 Attending:  Everardo All. Madilyn Fireman, M.D.                           Operative Report  PROCEDURE PERFORMED:  Colonoscopy with polypectomy.  ENDOSCOPIST:  Everardo All. Madilyn Fireman, M.D.  INDICATIONS FOR PROCEDURE:  Screening colonoscopy in a 77 year old patient.  DESCRIPTION OF PROCEDURE:  The patient was placed in the left lateral decubitus position and placed on the pulse monitor with continuous low flow oxygen delivered by nasal cannula.  He was sedated with 60 mg of IV Demerol and 6 mg of IV Versed.  The Olympus video colonoscope was inserted into the rectum and advanced to the cecum, as confirmed by transillumination of McBurneys point and visualization of the ileocecal valve and appendiceal orifice.  The prep was excellent.  The cecum appeared normal.  In the ascending colon was seen an 8 mm sessile polyp which was fulgurated by hot biopsy.  The remainder of the ascending, transverse and descending colon appeared normal with no further polyps, masses, diverticula or other mucosal abnormalities.  In the sigmoid colon there was seen an 8 mm sessile polyp which was fulgurated with hot biopsy.  In the rectum at approximately 20 cm was a flat 1 cm sessile polyp also fulgurated by hot biopsy.  All polyps were sent in the same specimen container.  The colonoscope was then withdrawn and the patient returned to the recovery room in stable condition.  The patient tolerated the procedure well and there were no immediate complications.  IMPRESSION: 1. Three colon polyps.  PLAN:  Await histology for determination of interval for next colonoscopy. DD:  03/06/01 TD:  03/06/01 Job: [id number]

## 2011-01-13 NOTE — Discharge Summary (Signed)
[name], ADRIAN NO.:  [id number]   MEDICAL RECORD NO.:  [id number]          PATIENT TYPE:  IPS   LOCATION:  4146                         FACILITY:  [facility]   PHYSICIAN:  Greg Cutter, P.A. DATE OF BIRTH:  December 21, 1925   DATE OF ADMISSION:  06/21/2006  DATE OF DISCHARGE:  06/29/2006                                 DISCHARGE SUMMARY   DISCHARGE DIAGNOSIS:  1. Left middle cerebral artery infarct with right upper extremity paresis      and __________ aphasia.  2. Diabetes mellitus, type 2.  3. Glaucoma.  4. Hypertension.  5. Anemia.  6. Dyslipidemia.   HISTORY OF PRESENT ILLNESS:  Mr. [name] is a 77 year old male with history  of diabetes, hypertension admitted June 13, 2006 with aphasia, right-  sided weakness.  CT of head showed no acute findings.  The patient underwent  cerebral angio at __________ treated with revascularization by Dr. Corliss Harding  the same day.  Carotid Doppler showed moderate right proximal ICA plate.  MRI, MRA of brain showed large acute left MCA territory infarct with  reestablished patency of left MCA territory.  The patient with VDRS past  admission, extubated October 19, without difficulty.  MBS done showed  patient with difficulty with mastication and patient placed on D-2 diet thin  liquids.  Currently, continues with severe expressive aphasia with question  of apraxia.  Limited spontaneous occurrences.  Left upper extremity plegia  noted with complaints of right wrist pain.  X-rays of wrist done, revealed  advanced radial, carpal, and distal ulna DJD, with evidence of carpal  instability and chronic deformity, osteophyte, __________ .  The patient  started on Neurontin for pain control.  Currently, the patient with  impairments in mobility and self-care and rehab consult for further therapy.   PAST MEDICAL HISTORY:  1. Diabetes mellitus.  2. Coronary artery disease.  3. PTCA x2.  4. CHF.  5. History of dermatitis past 2  years.  6. Glaucoma.  7. Right ankle ORIF.  8. DJD.  9. Question of sleep apnea.  10.Anemia.  11.Strabismus left eye.  12.History of pericardial effusion window September 28.   ALLERGIES:  1. PLAVIX.  2. NORVASC.  3. ZOCOR.  4. LANTUS.  5. SHELLFISH.   SOCIAL HISTORY:  The patient lives in one level home with three steps at  entry.  Has a daughter who lives with him.  Does not use any tobacco or  alcohol.  The patient was independent and driving prior to admission.   HOSPITAL COURSE:  Mr. [name] was admitted to rehab on June 21, 2006, for inpatient therapies to consist of PT, OT, speech therapy.  Past  admission, the patient was maintained on NPH insulin along, glipizide was  held.  Blood sugars were monitored on a.c. h.s. basis and have shown good  control ranging from occasional lows in 60s to 110 range.  The patient's  insulin has been decreased to 12 units subcutaneous b.i.d. prior to  discharge.  The patient's blood pressure was monitored on a b.i.d. basis and  showed good control ranging  from 110-120 systolic, 60-70s diastolic.  Heart  rate in the 80s range.  Oral intake has been good.  The patient has been  continent bowel and bladder.  Wrist and hand splint were ordered for left  hand to help with wrist pain.  Neurontin was decreased to 100 mg p.o. q.h.s.  Lidocaine patch was ordered to help with the left wrist pain.  This has been  used on 12 hours, off 12 hours.  Labs done past admission revealed H&H at 9.8 and 30.1.  Recheck of June 28, 2006, revealed hemoglobin 9.7 and 29.5.  Check of lytes from June 28, 2006, revealed sodium 139, potassium 4.6, chloride 108, CO2 28, BUN 17,  creatinine 1.6, glucose of 17.  LFTs showed total protein 5.9, albumin 2.6,  AST 27, ALT 15, and alk phos 8.6.  The patient was advanced to D-3 diet thin liquids with full supervision.  Therapies have been ongoing during the stay.  OT has worked with the patient  on right  upper extremity edema management including  positioning of hand and  range of motion.  The patient requires assist with organization of ADLs,  with max assist is independent once set up for bathing and dressing.  The  patient is at supervision level for balance.   ACTIVITY:  1. Close supervision for ambulating greater than 150 feet.  2. Close supervision for navigating for stairs.  3. He continues to require cuing secondary aphasia.  4. He is currently able to follow one-step commands at 75% accuracy.  5. He is able to gesture with mod assist to express needs and wants,      although, speech is limited to 1-2-3.   He will continue to receive further follow-up home health PT, OT, speech  therapy by [facility] past discharge.   On June 29, 2006, the patient is discharged to home.   DISCHARGED MEDICATIONS:  1. Aggrenox one p.o. b.i.d.  2. Tylenol 1-2, 30-45 minutes prior to Aggrenox.  3. NPH 12 units in a.m. and 12 units in the p.m.  4. Crestor 20 mg q.h.s.  5. Coated aspirin 81 mg a day.  6. Travatan 0.004% eye drops one OU q.h.s.  7. Alphagan 0.15%, one drop each eye two times a day.  8. Neurontin 100 mg q.h.s.  9. Senokot-S two q.h.s.  10.Lopressor 25 mg b.i.d.  11.Iron supplements every day.   DIET:  Diabetic diet.   SPECIAL INSTRUCTIONS:  1. Do not use glipizide.  2. Twenty-four hour supervision.  3. No alcohol.  4. No smoking.  5. No driving.  6. Wear splint , recent right hand for support at all times, check for      pressure areas.  7. CBG check a.c. h.s.  __________ blood sugars less than 200.   FOLLOWUP:  1. The patient to follow up with Dr. Wynn Harding on August 09, 2006 at      3:15.  2. Follow up with Dr. Prince Harding in 2 weeks.  3. Follow up with Dr. Pearlean Harding in six weeks.      Greg Cutter, P.A.     PP/MEDQ  D:  06/29/2006  T:  06/30/2006  Job:  [id number]   cc:   [name], M.D.  [name], M.D.  [name], Dr.

## 2011-01-13 NOTE — Consult Note (Signed)
[name], COFFIN NO.:  [id number]   MEDICAL RECORD NO.:  [id number]          PATIENT TYPE:  INP   LOCATION:  4709                         FACILITY:  [facility]   PHYSICIAN:  Wilber Bihari. Caryn Section, M.D.   DATE OF BIRTH:  [birth year]   DATE OF CONSULTATION:  DATE OF DISCHARGE:                                   CONSULTATION   DATE OF CONSULTATION:  May 24, 2006.   REASON FOR CONSULTATION:  [name] is a 77 year old white man admitted  with weakness, DOE, edema, and hypotension.  He developed edema a few weeks  ago, had echocardiogram which showed large pericardial effusion.  BUN/CR  were 21/1.7 in January 2007, now 99/4.7.  Potassium was 6.2 last night.  Renal consult was requested by Dr. Donnie Aho.   PAST MEDICAL HISTORY:  Diabetes mellitus 15-20 years (Dr. Lavada Mesi is  his primary care physician), hypertension 10 years, coronary artery disease.  He has had several cardiac cath and PCI.  Last cath was done January 2007 by  Dr. Ladona Ridgel with a cutting balloon angioplasty.  Cardiac cath was done August  2005 (Dr. Chanda Busing) BOTH RENAL ARTERIES NORMAL, rash eczematous  dermatitis as well as folliculitis.  Dr Nicholas Lose treated with prednisone April  2007.  MRSA was cultured from the folliculitis in April as well (treated  with Keflex, then doxycycline), glaucoma, surgery right ankle, right  shoulder, Dr. Thurston Hole.   MEDS PRIOR TO ADMISSION:  Aspirin 81/Crestor 20/iron 325/glipizide 10  b.i.d./Lantus 30/__________  40 b.i.d./lisinopril 40/metoprolol 25  b.i.d./terazosin 4/the eye drops for glaucoma.   REVIEW OF SYSTEMS:  Decreased force of the urinary stream, nocturia, edema,  DOE.  No renal cough, no gross hematuria, no melena, no hematochezia, no  angina, no claudication, no weight gain or weight loss.   SOCIAL HISTORY:  Born in [place], [place].  Finished 11th grade at  [employer], worked for [employer] and [employer], currently  retired.  50  pack years history cigarettes, none in the last 6-8 years.  Occasional beer, plays poker with friends on Tuesday night (not in the last  3 weeks secondary to feeling badly).  He is widower, he and his wife were  married for 48 years.  She died in 01/25/2003.  He lives with his daughter,  [name].   FAMILY HISTORY:  Father died at age 51 of congestive heart failure.  Mother  died at age 23 of cancer.  One brother, 80, has had CVA in the past.  Four  daughters (well).   CURRENT MEDICATIONS:  1. Lantus 30/d.  2. NovoLog sliding scale.  3. Aspirin 81/d.  4. Half normal saline 75 an hour.   ALLERGIES:  IODINE and SHELLFISH. ? STATINS.   PHYSICAL EXAMINATION:  He is awake, alert, friendly.  Temperature 97.6,  pulse 94, respirations 20, blood pressure 90/50.  CHEST:  Clear.  HEART:  No rub.  ABDOMEN:  Nontender.  EXTREMITIES:  No edema.  SKIN:  Rash on arms, punctate 3 mm papules, red, pruritic.  No atheroembolic  changes noted on toes.  NEURO:  Right  handed, strength equal, sensation intact.   LABORATORY DATA:  Urinalysis done by me, dipstick negative, microscopy shows  rare epithelial cells, no RBC's, no WBC's, rare granular cast.  Rheumatoid  factor negative.  ANA pending, ESR 36, TSH 2.  Renal ultrasound not done  yet.  24 hour urine protein 45 mg, 24 hour urinary creatinine 1718 mg  (September 2005 SPET and UPE were negative September 2005.  Renal ultrasound  (August 2005) right kidney 10.7 cm, left kidney 11.0 cm, no hydronephrosis.   IMPRESSION:  1. Acute renal failure with normal urinalysis.  2. Pericardial effusion (no tamponade-yet).  3. Skin rash folliculitis plus eczematous dermatitis followed by Dr.      Venancio Poisson.  4. Coronary artery disease S/P PCI (S).  5. Hypertension (not now).  6. DM-2.   RECOMMENDATIONS:  With normal urinalysis, differential diagnosis would be  acute renal failure/ATN or obstruction (pre renal from bilateral renal  arteries stenosis, poor pump,  more post renal bilateral ureteral  obstruction).  Will await ultrasound, check urine sodium/creatinine.  If  ultrasound negative, then would send to [facility] by __________ for renal artery  duplex scan (negative arteriogram 2005-2006).           ______________________________  Wilber Bihari. Caryn Section, M.D.     RFF/MEDQ  D:  05/24/2006  T:  05/24/2006  Job:  [id number]

## 2011-01-17 ENCOUNTER — Encounter (HOSPITAL_COMMUNITY): Payer: MEDICARE

## 2011-01-17 ENCOUNTER — Other Ambulatory Visit: Payer: Self-pay | Admitting: Nephrology

## 2011-01-18 LAB — POCT HEMOGLOBIN-HEMACUE: Hemoglobin: 11.2 g/dL — ABNORMAL LOW (ref 13.0–17.0)

## 2011-01-20 ENCOUNTER — Encounter (HOSPITAL_COMMUNITY): Payer: MEDICARE

## 2011-01-20 ENCOUNTER — Encounter (HOSPITAL_COMMUNITY): Payer: Self-pay

## 2011-01-31 ENCOUNTER — Other Ambulatory Visit: Payer: Self-pay | Admitting: Nephrology

## 2011-01-31 ENCOUNTER — Encounter (HOSPITAL_COMMUNITY): Payer: Medicare Other | Attending: Nephrology

## 2011-01-31 DIAGNOSIS — N184 Chronic kidney disease, stage 4 (severe): Secondary | ICD-10-CM | POA: Insufficient documentation

## 2011-01-31 DIAGNOSIS — D638 Anemia in other chronic diseases classified elsewhere: Secondary | ICD-10-CM | POA: Insufficient documentation

## 2011-01-31 LAB — IRON AND TIBC
Iron: 64 ug/dL (ref 42–135)
TIBC: 240 ug/dL (ref 215–435)
UIBC: 176 ug/dL

## 2011-02-01 LAB — FERRITIN: Ferritin: 1588 ng/mL — ABNORMAL HIGH (ref 22–322)

## 2011-02-14 ENCOUNTER — Other Ambulatory Visit: Payer: Self-pay | Admitting: Nephrology

## 2011-02-14 ENCOUNTER — Encounter (HOSPITAL_COMMUNITY): Payer: Medicare Other

## 2011-02-28 ENCOUNTER — Encounter (HOSPITAL_COMMUNITY): Payer: Medicare Other | Attending: Nephrology

## 2011-02-28 ENCOUNTER — Other Ambulatory Visit: Payer: Self-pay | Admitting: Nephrology

## 2011-02-28 DIAGNOSIS — D638 Anemia in other chronic diseases classified elsewhere: Secondary | ICD-10-CM | POA: Insufficient documentation

## 2011-02-28 DIAGNOSIS — N184 Chronic kidney disease, stage 4 (severe): Secondary | ICD-10-CM | POA: Insufficient documentation

## 2011-02-28 LAB — IRON AND TIBC
Iron: 41 ug/dL — ABNORMAL LOW (ref 42–135)
Saturation Ratios: 18 % — ABNORMAL LOW (ref 20–55)
UIBC: 186 ug/dL

## 2011-02-28 LAB — FERRITIN: Ferritin: 681 ng/mL — ABNORMAL HIGH (ref 22–322)

## 2011-03-14 ENCOUNTER — Encounter (HOSPITAL_COMMUNITY): Payer: Medicare Other

## 2011-03-14 ENCOUNTER — Encounter (HOSPITAL_COMMUNITY): Payer: Self-pay

## 2011-03-14 ENCOUNTER — Other Ambulatory Visit: Payer: Self-pay | Admitting: Nephrology

## 2011-03-14 LAB — POCT HEMOGLOBIN-HEMACUE: Hemoglobin: 11.8 g/dL — ABNORMAL LOW (ref 13.0–17.0)

## 2011-03-21 ENCOUNTER — Encounter (HOSPITAL_COMMUNITY): Payer: Medicare Other

## 2011-03-28 ENCOUNTER — Encounter (HOSPITAL_COMMUNITY): Payer: Medicare Other

## 2011-03-28 ENCOUNTER — Other Ambulatory Visit: Payer: Self-pay | Admitting: Nephrology

## 2011-04-11 ENCOUNTER — Encounter (HOSPITAL_COMMUNITY): Payer: Medicare Other | Attending: Nephrology

## 2011-04-11 ENCOUNTER — Other Ambulatory Visit: Payer: Self-pay | Admitting: Anesthesiology

## 2011-04-11 DIAGNOSIS — D638 Anemia in other chronic diseases classified elsewhere: Secondary | ICD-10-CM | POA: Insufficient documentation

## 2011-04-11 DIAGNOSIS — N184 Chronic kidney disease, stage 4 (severe): Secondary | ICD-10-CM | POA: Insufficient documentation

## 2011-04-11 LAB — IRON AND TIBC
Iron: 53 ug/dL (ref 42–135)
Saturation Ratios: 23 % (ref 20–55)
TIBC: 232 ug/dL (ref 215–435)

## 2011-04-11 LAB — FERRITIN: Ferritin: 1291 ng/mL — ABNORMAL HIGH (ref 22–322)

## 2011-04-25 ENCOUNTER — Encounter (HOSPITAL_COMMUNITY): Payer: Medicare Other

## 2011-04-25 ENCOUNTER — Other Ambulatory Visit: Payer: Self-pay | Admitting: Anesthesiology

## 2011-04-25 LAB — POCT HEMOGLOBIN-HEMACUE: Hemoglobin: 10.4 g/dL — ABNORMAL LOW (ref 13.0–17.0)

## 2011-05-09 ENCOUNTER — Encounter (HOSPITAL_COMMUNITY): Payer: Medicare Other

## 2011-05-23 ENCOUNTER — Other Ambulatory Visit: Payer: Self-pay | Admitting: Nephrology

## 2011-05-23 ENCOUNTER — Encounter (HOSPITAL_COMMUNITY): Payer: Medicare Other | Attending: Nephrology

## 2011-05-23 DIAGNOSIS — D638 Anemia in other chronic diseases classified elsewhere: Secondary | ICD-10-CM | POA: Insufficient documentation

## 2011-05-23 DIAGNOSIS — N184 Chronic kidney disease, stage 4 (severe): Secondary | ICD-10-CM | POA: Insufficient documentation

## 2011-06-14 LAB — CULTURE, BLOOD (ROUTINE X 2): Culture: NO GROWTH

## 2011-06-14 LAB — CBC
HCT: 35.8 — ABNORMAL LOW
MCV: 77.3 — ABNORMAL LOW
Platelets: 278
RDW: 18.1 — ABNORMAL HIGH
WBC: 5.8

## 2011-06-14 LAB — URINALYSIS, ROUTINE W REFLEX MICROSCOPIC
Glucose, UA: NEGATIVE
Hgb urine dipstick: NEGATIVE
Protein, ur: NEGATIVE
Specific Gravity, Urine: 1.021
Urobilinogen, UA: 1

## 2011-06-14 LAB — I-STAT 8, (EC8 V) (CONVERTED LAB)
Acid-base deficit: 1
Chloride: 105
Hemoglobin: 14.3
Potassium: 4.4
Sodium: 138
TCO2: 26
pH, Ven: 7.383 — ABNORMAL HIGH

## 2011-06-14 LAB — DIFFERENTIAL
Basophils Absolute: 0
Basophils Relative: 0
Eosinophils Absolute: 0.1
Eosinophils Relative: 2
Lymphs Abs: 1.1
Neutrophils Relative %: 66

## 2011-06-14 LAB — POCT I-STAT CREATININE: Creatinine, Ser: 1.8 — ABNORMAL HIGH

## 2011-06-20 ENCOUNTER — Encounter (HOSPITAL_COMMUNITY): Payer: Medicare Other | Attending: Nephrology

## 2011-06-20 ENCOUNTER — Other Ambulatory Visit: Payer: Self-pay | Admitting: Nephrology

## 2011-06-20 DIAGNOSIS — N184 Chronic kidney disease, stage 4 (severe): Secondary | ICD-10-CM | POA: Insufficient documentation

## 2011-06-20 DIAGNOSIS — D638 Anemia in other chronic diseases classified elsewhere: Secondary | ICD-10-CM | POA: Insufficient documentation

## 2011-06-20 LAB — RENAL FUNCTION PANEL
Albumin: 3.5 g/dL (ref 3.5–5.2)
CO2: 27 mEq/L (ref 19–32)
Calcium: 9 mg/dL (ref 8.4–10.5)
Creatinine, Ser: 2.04 mg/dL — ABNORMAL HIGH (ref 0.50–1.35)
GFR calc Af Amer: 33 mL/min — ABNORMAL LOW (ref >90)
GFR calc non Af Amer: 28 mL/min — ABNORMAL LOW (ref >90)
Phosphorus: 3.2 mg/dL (ref 2.3–4.6)
Sodium: 140 mEq/L (ref 135–145)

## 2011-06-20 LAB — FERRITIN: Ferritin: 96 ng/mL (ref 22–322)

## 2011-06-20 LAB — IRON AND TIBC: TIBC: 289 ug/dL (ref 215–435)

## 2011-06-28 ENCOUNTER — Other Ambulatory Visit (HOSPITAL_COMMUNITY): Payer: Self-pay | Admitting: *Deleted

## 2011-06-30 ENCOUNTER — Encounter (HOSPITAL_COMMUNITY)
Admission: RE | Admit: 2011-06-30 | Discharge: 2011-06-30 | Disposition: A | Discharge status: Home / Self Care | Payer: Medicare Other | Source: Ambulatory Visit | Attending: Nephrology | Admitting: Nephrology

## 2011-06-30 DIAGNOSIS — N184 Chronic kidney disease, stage 4 (severe): Secondary | ICD-10-CM | POA: Insufficient documentation

## 2011-06-30 DIAGNOSIS — D638 Anemia in other chronic diseases classified elsewhere: Secondary | ICD-10-CM | POA: Insufficient documentation

## 2011-07-04 ENCOUNTER — Encounter (HOSPITAL_COMMUNITY)
Admission: RE | Admit: 2011-07-04 | Discharge: 2011-07-04 | Disposition: A | Discharge status: Home / Self Care | Payer: Medicare Other | Source: Ambulatory Visit | Attending: Nephrology | Admitting: Nephrology

## 2011-07-04 MED ORDER — FERUMOXYTOL INJECTION 510 MG/17 ML: 510.0000 mg | INTRAVENOUS | Status: DC

## 2011-07-04 MED ORDER — FERUMOXYTOL INJECTION 510 MG/17 ML
510.0000 mg | Freq: Once | INTRAVENOUS | Status: AC
  Administered 2011-07-04: 510 mg via INTRAVENOUS

## 2011-07-17 ENCOUNTER — Other Ambulatory Visit (HOSPITAL_COMMUNITY): Payer: Self-pay | Admitting: *Deleted

## 2011-07-18 ENCOUNTER — Encounter (HOSPITAL_COMMUNITY): Payer: Medicare Other

## 2011-07-18 ENCOUNTER — Encounter (HOSPITAL_COMMUNITY)
Admission: RE | Admit: 2011-07-18 | Discharge: 2011-07-18 | Disposition: A | Discharge status: Home / Self Care | Payer: Medicare Other | Source: Ambulatory Visit | Attending: Nephrology | Admitting: Nephrology

## 2011-07-18 LAB — RENAL FUNCTION PANEL
Albumin: 3.5 g/dL (ref 3.5–5.2)
BUN: 35 mg/dL — ABNORMAL HIGH (ref 6–23)
Calcium: 9.4 mg/dL (ref 8.4–10.5)
Creatinine, Ser: 1.88 mg/dL — ABNORMAL HIGH (ref 0.50–1.35)
Glucose, Bld: 206 mg/dL — ABNORMAL HIGH (ref 70–99)
Phosphorus: 3.1 mg/dL (ref 2.3–4.6)
Potassium: 4.1 mEq/L (ref 3.5–5.1)

## 2011-07-18 LAB — IRON AND TIBC
Iron: 54 ug/dL (ref 42–135)
Saturation Ratios: 22 % (ref 20–55)
TIBC: 245 ug/dL (ref 215–435)
UIBC: 191 ug/dL (ref 125–400)

## 2011-07-18 LAB — POCT HEMOGLOBIN-HEMACUE: Hemoglobin: 10.3 g/dL — ABNORMAL LOW (ref 13.0–17.0)

## 2011-07-18 MED ORDER — EPOETIN ALFA 10000 UNIT/ML IJ SOLN
20000.0000 [IU] | INTRAMUSCULAR | Status: DC
  Filled 2011-07-18: qty 1

## 2011-07-18 MED ORDER — EPOETIN ALFA 20000 UNIT/ML IJ SOLN
INTRAMUSCULAR | Status: AC
  Administered 2011-07-18: 30000 [IU] via SUBCUTANEOUS
  Filled 2011-07-18: qty 1

## 2011-08-15 ENCOUNTER — Encounter (HOSPITAL_COMMUNITY)
Admission: RE | Admit: 2011-08-15 | Discharge: 2011-08-15 | Disposition: A | Discharge status: Home / Self Care | Payer: Medicare Other | Source: Ambulatory Visit | Attending: Nephrology | Admitting: Nephrology

## 2011-08-15 DIAGNOSIS — D638 Anemia in other chronic diseases classified elsewhere: Secondary | ICD-10-CM | POA: Insufficient documentation

## 2011-08-15 DIAGNOSIS — N184 Chronic kidney disease, stage 4 (severe): Secondary | ICD-10-CM | POA: Insufficient documentation

## 2011-08-15 LAB — RENAL FUNCTION PANEL
Albumin: 3.5 g/dL (ref 3.5–5.2)
CO2: 28 mEq/L (ref 19–32)
Chloride: 102 mEq/L (ref 96–112)
Creatinine, Ser: 1.82 mg/dL — ABNORMAL HIGH (ref 0.50–1.35)
GFR calc Af Amer: 38 mL/min — ABNORMAL LOW (ref >90)
GFR calc non Af Amer: 33 mL/min — ABNORMAL LOW (ref >90)
Sodium: 140 mEq/L (ref 135–145)

## 2011-08-15 LAB — FERRITIN: Ferritin: 170 ng/mL (ref 22–322)

## 2011-08-15 LAB — IRON AND TIBC
Iron: 54 ug/dL (ref 42–135)
UIBC: 227 ug/dL (ref 125–400)

## 2011-08-15 MED ORDER — EPOETIN ALFA 20000 UNIT/ML IJ SOLN
INTRAMUSCULAR | Status: AC
  Filled 2011-08-15: qty 1

## 2011-08-15 MED ORDER — EPOETIN ALFA 10000 UNIT/ML IJ SOLN
30000.0000 [IU] | INTRAMUSCULAR | Status: DC
  Administered 2011-08-15: 30000 [IU] via SUBCUTANEOUS

## 2011-08-15 MED ORDER — EPOETIN ALFA 10000 UNIT/ML IJ SOLN
INTRAMUSCULAR | Status: AC
  Administered 2011-08-15: 30000 [IU] via SUBCUTANEOUS
  Filled 2011-08-15: qty 1

## 2011-09-04 ENCOUNTER — Other Ambulatory Visit (HOSPITAL_COMMUNITY): Payer: Self-pay | Admitting: *Deleted

## 2011-09-08 ENCOUNTER — Encounter (HOSPITAL_COMMUNITY)
Admission: RE | Admit: 2011-09-08 | Discharge: 2011-09-08 | Disposition: A | Discharge status: Home / Self Care | Payer: Medicare Other | Source: Ambulatory Visit | Attending: Nephrology | Admitting: Nephrology

## 2011-09-08 DIAGNOSIS — N184 Chronic kidney disease, stage 4 (severe): Secondary | ICD-10-CM | POA: Insufficient documentation

## 2011-09-08 DIAGNOSIS — D638 Anemia in other chronic diseases classified elsewhere: Secondary | ICD-10-CM | POA: Insufficient documentation

## 2011-09-08 MED ORDER — FERUMOXYTOL INJECTION 510 MG/17 ML
INTRAVENOUS | Status: AC
  Administered 2011-09-08: 12:00:00
  Filled 2011-09-08: qty 17

## 2011-09-08 MED ORDER — FERUMOXYTOL INJECTION 510 MG/17 ML: 510.0000 mg | INTRAVENOUS | Status: DC

## 2011-09-12 ENCOUNTER — Encounter (HOSPITAL_COMMUNITY): Payer: Medicare Other

## 2011-09-12 ENCOUNTER — Encounter (HOSPITAL_COMMUNITY)
Admission: RE | Admit: 2011-09-12 | Discharge: 2011-09-12 | Disposition: A | Discharge status: Home / Self Care | Payer: Medicare Other | Source: Ambulatory Visit | Attending: Nephrology | Admitting: Nephrology

## 2011-09-12 LAB — RENAL FUNCTION PANEL
BUN: 28 mg/dL — ABNORMAL HIGH (ref 6–23)
Chloride: 100 mEq/L (ref 96–112)
Glucose, Bld: 241 mg/dL — ABNORMAL HIGH (ref 70–99)
Potassium: 4.3 mEq/L (ref 3.5–5.1)

## 2011-09-12 LAB — IRON AND TIBC: Saturation Ratios: 57 % — ABNORMAL HIGH (ref 20–55)

## 2011-09-12 MED ORDER — EPOETIN ALFA 10000 UNIT/ML IJ SOLN
INTRAMUSCULAR | Status: AC
  Administered 2011-09-12: 10000 [IU]
  Filled 2011-09-12: qty 1

## 2011-09-12 MED ORDER — FERUMOXYTOL INJECTION 510 MG/17 ML
510.0000 mg | Freq: Once | INTRAVENOUS | Status: AC
  Administered 2011-09-12: 510 mg via INTRAVENOUS

## 2011-09-12 MED ORDER — EPOETIN ALFA 10000 UNIT/ML IJ SOLN: 30000.0000 [IU] | INTRAMUSCULAR | Status: DC

## 2011-09-12 MED ORDER — EPOETIN ALFA 20000 UNIT/ML IJ SOLN
INTRAMUSCULAR | Status: AC
  Administered 2011-09-12: 20000 [IU]
  Filled 2011-09-12: qty 1

## 2011-09-12 MED ORDER — FERUMOXYTOL INJECTION 510 MG/17 ML
INTRAVENOUS | Status: AC
  Filled 2011-09-12: qty 17

## 2011-09-13 LAB — PTH, INTACT AND CALCIUM
Calcium, Total (PTH): 8.8 mg/dL (ref 8.4–10.5)
PTH: 95.1 pg/mL — ABNORMAL HIGH (ref 14.0–72.0)

## 2011-09-14 LAB — POCT HEMOGLOBIN-HEMACUE: Hemoglobin: 9.4 g/dL — ABNORMAL LOW (ref 13.0–17.0)

## 2011-10-10 ENCOUNTER — Encounter (HOSPITAL_COMMUNITY)
Admission: RE | Admit: 2011-10-10 | Discharge: 2011-10-10 | Disposition: A | Discharge status: Home / Self Care | Payer: Medicare Other | Source: Ambulatory Visit | Attending: Nephrology | Admitting: Nephrology

## 2011-10-10 DIAGNOSIS — D638 Anemia in other chronic diseases classified elsewhere: Secondary | ICD-10-CM | POA: Insufficient documentation

## 2011-10-10 DIAGNOSIS — N184 Chronic kidney disease, stage 4 (severe): Secondary | ICD-10-CM | POA: Insufficient documentation

## 2011-10-10 LAB — RENAL FUNCTION PANEL
CO2: 28 mEq/L (ref 19–32)
Calcium: 9.7 mg/dL (ref 8.4–10.5)
Creatinine, Ser: 1.92 mg/dL — ABNORMAL HIGH (ref 0.50–1.35)
GFR calc Af Amer: 36 mL/min — ABNORMAL LOW (ref >90)
Glucose, Bld: 241 mg/dL — ABNORMAL HIGH (ref 70–99)

## 2011-10-10 LAB — IRON AND TIBC
Iron: 39 ug/dL — ABNORMAL LOW (ref 42–135)
TIBC: 259 ug/dL (ref 215–435)

## 2011-10-10 MED ORDER — EPOETIN ALFA 10000 UNIT/ML IJ SOLN: 30000.0000 [IU] | INTRAMUSCULAR | Status: DC

## 2011-10-16 ENCOUNTER — Other Ambulatory Visit (HOSPITAL_COMMUNITY): Payer: Self-pay | Admitting: *Deleted

## 2011-10-16 ENCOUNTER — Encounter (HOSPITAL_COMMUNITY)
Admission: RE | Admit: 2011-10-16 | Discharge: 2011-10-16 | Disposition: A | Discharge status: Home / Self Care | Payer: Medicare Other | Source: Ambulatory Visit | Attending: Nephrology | Admitting: Nephrology

## 2011-10-16 MED ORDER — FERUMOXYTOL INJECTION 510 MG/17 ML
INTRAVENOUS | Status: AC
  Administered 2011-10-16: 510 mg via INTRAVENOUS
  Filled 2011-10-16: qty 17

## 2011-10-16 MED ORDER — FERUMOXYTOL INJECTION 510 MG/17 ML
510.0000 mg | INTRAVENOUS | Status: AC
  Administered 2011-10-16: 510 mg via INTRAVENOUS

## 2011-10-20 ENCOUNTER — Encounter (HOSPITAL_COMMUNITY)
Admission: RE | Admit: 2011-10-20 | Discharge: 2011-10-20 | Disposition: A | Discharge status: Home / Self Care | Payer: Medicare Other | Source: Ambulatory Visit | Attending: Nephrology | Admitting: Nephrology

## 2011-10-20 LAB — POCT HEMOGLOBIN-HEMACUE: Hemoglobin: 9.9 g/dL — ABNORMAL LOW (ref 13.0–17.0)

## 2011-10-20 MED ORDER — EPOETIN ALFA 10000 UNIT/ML IJ SOLN
30000.0000 [IU] | INTRAMUSCULAR | Status: DC
  Administered 2011-10-20: 30000 [IU] via SUBCUTANEOUS
  Filled 2011-10-20: qty 1

## 2011-10-20 MED ORDER — FERUMOXYTOL INJECTION 510 MG/17 ML
510.0000 mg | Freq: Once | INTRAVENOUS | Status: AC
  Administered 2011-10-20: 510 mg via INTRAVENOUS
  Filled 2011-10-20: qty 17

## 2011-10-20 MED ORDER — EPOETIN ALFA 20000 UNIT/ML IJ SOLN
INTRAMUSCULAR | Status: AC
  Filled 2011-10-20: qty 1

## 2011-10-20 MED ORDER — FERUMOXYTOL INJECTION 510 MG/17 ML: 510.0000 mg | INTRAVENOUS | Status: DC

## 2011-11-07 ENCOUNTER — Ambulatory Visit
Admission: RE | Admit: 2011-11-07 | Discharge: 2011-11-07 | Disposition: A | Discharge status: Home / Self Care | Payer: Medicare Other | Source: Ambulatory Visit | Attending: Cardiology | Admitting: Cardiology

## 2011-11-07 ENCOUNTER — Other Ambulatory Visit: Payer: Self-pay | Admitting: Cardiology

## 2011-11-07 ENCOUNTER — Encounter (HOSPITAL_COMMUNITY): Payer: Medicare Other

## 2011-11-07 DIAGNOSIS — J90 Pleural effusion, not elsewhere classified: Secondary | ICD-10-CM

## 2011-11-13 ENCOUNTER — Other Ambulatory Visit (HOSPITAL_COMMUNITY): Payer: Self-pay | Admitting: *Deleted

## 2011-11-14 ENCOUNTER — Encounter (HOSPITAL_COMMUNITY)
Admission: RE | Admit: 2011-11-14 | Discharge: 2011-11-14 | Disposition: A | Discharge status: Home / Self Care | Payer: Medicare Other | Source: Ambulatory Visit | Attending: Nephrology | Admitting: Nephrology

## 2011-11-14 DIAGNOSIS — N184 Chronic kidney disease, stage 4 (severe): Secondary | ICD-10-CM | POA: Insufficient documentation

## 2011-11-14 DIAGNOSIS — D638 Anemia in other chronic diseases classified elsewhere: Secondary | ICD-10-CM | POA: Insufficient documentation

## 2011-11-14 LAB — POCT HEMOGLOBIN-HEMACUE: Hemoglobin: 11.9 g/dL — ABNORMAL LOW (ref 13.0–17.0)

## 2011-11-14 LAB — RENAL FUNCTION PANEL
BUN: 28 mg/dL — ABNORMAL HIGH (ref 6–23)
CO2: 30 mEq/L (ref 19–32)
Calcium: 9.3 mg/dL (ref 8.4–10.5)
Chloride: 100 mEq/L (ref 96–112)
Creatinine, Ser: 1.87 mg/dL — ABNORMAL HIGH (ref 0.50–1.35)

## 2011-11-14 LAB — IRON AND TIBC
Iron: 54 ug/dL (ref 42–135)
TIBC: 234 ug/dL (ref 215–435)

## 2011-11-14 MED ORDER — EPOETIN ALFA 10000 UNIT/ML IJ SOLN: 30000.0000 [IU] | INTRAMUSCULAR | Status: DC

## 2011-11-15 LAB — PTH, INTACT AND CALCIUM: Calcium, Total (PTH): 9.3 mg/dL (ref 8.4–10.5)

## 2011-11-28 ENCOUNTER — Encounter (HOSPITAL_COMMUNITY)
Admission: RE | Admit: 2011-11-28 | Discharge: 2011-11-28 | Disposition: A | Discharge status: Home / Self Care | Payer: Medicare Other | Source: Ambulatory Visit | Attending: Nephrology | Admitting: Nephrology

## 2011-11-28 DIAGNOSIS — D638 Anemia in other chronic diseases classified elsewhere: Secondary | ICD-10-CM | POA: Insufficient documentation

## 2011-11-28 DIAGNOSIS — N184 Chronic kidney disease, stage 4 (severe): Secondary | ICD-10-CM | POA: Insufficient documentation

## 2011-11-28 LAB — POCT HEMOGLOBIN-HEMACUE: Hemoglobin: 11.6 g/dL — ABNORMAL LOW (ref 13.0–17.0)

## 2011-11-28 MED ORDER — EPOETIN ALFA 10000 UNIT/ML IJ SOLN: 30000.0000 [IU] | INTRAMUSCULAR | Status: DC

## 2011-12-12 ENCOUNTER — Encounter (HOSPITAL_COMMUNITY)
Admission: RE | Admit: 2011-12-12 | Discharge: 2011-12-12 | Disposition: A | Discharge status: Home / Self Care | Payer: Medicare Other | Source: Ambulatory Visit | Attending: Nephrology | Admitting: Nephrology

## 2011-12-12 ENCOUNTER — Encounter (HOSPITAL_COMMUNITY): Payer: Medicare Other

## 2011-12-12 LAB — RENAL FUNCTION PANEL
Albumin: 3.7 g/dL (ref 3.5–5.2)
BUN: 34 mg/dL — ABNORMAL HIGH (ref 6–23)
CO2: 25 mEq/L (ref 19–32)
Chloride: 99 mEq/L (ref 96–112)
Glucose, Bld: 190 mg/dL — ABNORMAL HIGH (ref 70–99)
Potassium: 4 mEq/L (ref 3.5–5.1)

## 2011-12-12 LAB — POCT HEMOGLOBIN-HEMACUE: Hemoglobin: 11.5 g/dL — ABNORMAL LOW (ref 13.0–17.0)

## 2011-12-12 LAB — IRON AND TIBC
Iron: 50 ug/dL (ref 42–135)
UIBC: 213 ug/dL (ref 125–400)

## 2011-12-12 MED ORDER — EPOETIN ALFA 10000 UNIT/ML IJ SOLN: 30000.0000 [IU] | INTRAMUSCULAR | Status: DC

## 2011-12-21 ENCOUNTER — Other Ambulatory Visit (HOSPITAL_COMMUNITY): Payer: Self-pay | Admitting: *Deleted

## 2011-12-26 ENCOUNTER — Encounter (HOSPITAL_COMMUNITY): Payer: Medicare Other

## 2011-12-26 ENCOUNTER — Encounter (HOSPITAL_COMMUNITY)
Admission: RE | Admit: 2011-12-26 | Discharge: 2011-12-26 | Disposition: A | Discharge status: Home / Self Care | Payer: Medicare Other | Source: Ambulatory Visit | Attending: Nephrology | Admitting: Nephrology

## 2011-12-26 LAB — POCT HEMOGLOBIN-HEMACUE: Hemoglobin: 11.5 g/dL — ABNORMAL LOW (ref 13.0–17.0)

## 2011-12-26 MED ORDER — EPOETIN ALFA 10000 UNIT/ML IJ SOLN: 30000.0000 [IU] | INTRAMUSCULAR | Status: DC

## 2011-12-26 MED ORDER — FERUMOXYTOL INJECTION 510 MG/17 ML
510.0000 mg | INTRAVENOUS | Status: DC
  Administered 2011-12-26: 510 mg via INTRAVENOUS
  Filled 2011-12-26: qty 17

## 2011-12-26 MED ORDER — SODIUM CHLORIDE 0.9 % IV SOLN: INTRAVENOUS | Status: DC

## 2012-01-01 ENCOUNTER — Encounter (HOSPITAL_COMMUNITY)
Admission: RE | Admit: 2012-01-01 | Discharge: 2012-01-01 | Disposition: A | Discharge status: Home / Self Care | Payer: Medicare Other | Source: Ambulatory Visit | Attending: Nephrology | Admitting: Nephrology

## 2012-01-01 DIAGNOSIS — D638 Anemia in other chronic diseases classified elsewhere: Secondary | ICD-10-CM | POA: Insufficient documentation

## 2012-01-01 DIAGNOSIS — N184 Chronic kidney disease, stage 4 (severe): Secondary | ICD-10-CM | POA: Insufficient documentation

## 2012-01-01 MED ORDER — SODIUM CHLORIDE 0.9 % IV SOLN
INTRAVENOUS | Status: DC
  Administered 2012-01-01: 13:00:00 via INTRAVENOUS

## 2012-01-01 MED ORDER — FERUMOXYTOL INJECTION 510 MG/17 ML
510.0000 mg | Freq: Once | INTRAVENOUS | Status: AC
  Administered 2012-01-01: 510 mg via INTRAVENOUS
  Filled 2012-01-01: qty 17

## 2012-01-09 ENCOUNTER — Encounter (HOSPITAL_COMMUNITY)
Admission: RE | Admit: 2012-01-09 | Discharge: 2012-01-09 | Disposition: A | Discharge status: Home / Self Care | Payer: Medicare Other | Source: Ambulatory Visit | Attending: Nephrology | Admitting: Nephrology

## 2012-01-09 LAB — RENAL FUNCTION PANEL
CO2: 31 mEq/L (ref 19–32)
Chloride: 99 mEq/L (ref 96–112)
GFR calc Af Amer: 36 mL/min — ABNORMAL LOW (ref >90)
Glucose, Bld: 119 mg/dL — ABNORMAL HIGH (ref 70–99)
Potassium: 3.8 mEq/L (ref 3.5–5.1)
Sodium: 141 mEq/L (ref 135–145)

## 2012-01-09 LAB — POCT HEMOGLOBIN-HEMACUE: Hemoglobin: 12.6 g/dL — ABNORMAL LOW (ref 13.0–17.0)

## 2012-01-10 LAB — PTH, INTACT AND CALCIUM
Calcium, Total (PTH): 9.5 mg/dL (ref 8.4–10.5)
PTH: 58.3 pg/mL (ref 14.0–72.0)

## 2012-01-23 ENCOUNTER — Encounter (HOSPITAL_COMMUNITY)
Admission: RE | Admit: 2012-01-23 | Discharge: 2012-01-23 | Disposition: A | Discharge status: Home / Self Care | Payer: Medicare Other | Source: Ambulatory Visit | Attending: Nephrology | Admitting: Nephrology

## 2012-01-23 MED ORDER — EPOETIN ALFA 10000 UNIT/ML IJ SOLN: 30000.0000 [IU] | INTRAMUSCULAR | Status: DC

## 2012-01-24 LAB — PTH, INTACT AND CALCIUM: PTH: 87.7 pg/mL — ABNORMAL HIGH (ref 14.0–72.0)

## 2012-02-05 ENCOUNTER — Other Ambulatory Visit (HOSPITAL_COMMUNITY): Payer: Self-pay | Admitting: *Deleted

## 2012-02-06 ENCOUNTER — Encounter (HOSPITAL_COMMUNITY)
Admission: RE | Admit: 2012-02-06 | Discharge: 2012-02-06 | Disposition: A | Discharge status: Home / Self Care | Payer: Medicare Other | Source: Ambulatory Visit | Attending: Nephrology | Admitting: Nephrology

## 2012-02-06 DIAGNOSIS — N184 Chronic kidney disease, stage 4 (severe): Secondary | ICD-10-CM | POA: Insufficient documentation

## 2012-02-06 DIAGNOSIS — D638 Anemia in other chronic diseases classified elsewhere: Secondary | ICD-10-CM | POA: Insufficient documentation

## 2012-02-06 LAB — RENAL FUNCTION PANEL
CO2: 30 mEq/L (ref 19–32)
Calcium: 9.7 mg/dL (ref 8.4–10.5)
Chloride: 100 mEq/L (ref 96–112)
GFR calc Af Amer: 36 mL/min — ABNORMAL LOW (ref >90)
GFR calc non Af Amer: 31 mL/min — ABNORMAL LOW (ref >90)
Potassium: 4 mEq/L (ref 3.5–5.1)
Sodium: 141 mEq/L (ref 135–145)

## 2012-02-06 LAB — POCT HEMOGLOBIN-HEMACUE: Hemoglobin: 13.2 g/dL (ref 13.0–17.0)

## 2012-02-06 MED ORDER — EPOETIN ALFA 10000 UNIT/ML IJ SOLN: 30000.0000 [IU] | INTRAMUSCULAR | Status: DC

## 2012-02-07 LAB — IRON AND TIBC: TIBC: 239 ug/dL (ref 215–435)

## 2012-02-07 LAB — FERRITIN: Ferritin: 603 ng/mL — ABNORMAL HIGH (ref 22–322)

## 2012-02-20 ENCOUNTER — Encounter (HOSPITAL_COMMUNITY): Payer: Medicare Other

## 2012-03-01 ENCOUNTER — Other Ambulatory Visit (HOSPITAL_COMMUNITY): Payer: Self-pay | Admitting: *Deleted

## 2012-03-05 ENCOUNTER — Encounter (HOSPITAL_COMMUNITY)
Admission: RE | Admit: 2012-03-05 | Discharge: 2012-03-05 | Disposition: A | Discharge status: Home / Self Care | Payer: Medicare Other | Source: Ambulatory Visit | Attending: Nephrology | Admitting: Nephrology

## 2012-03-05 DIAGNOSIS — N184 Chronic kidney disease, stage 4 (severe): Secondary | ICD-10-CM | POA: Insufficient documentation

## 2012-03-05 DIAGNOSIS — D638 Anemia in other chronic diseases classified elsewhere: Secondary | ICD-10-CM | POA: Insufficient documentation

## 2012-03-05 LAB — RENAL FUNCTION PANEL
BUN: 28 mg/dL — ABNORMAL HIGH (ref 6–23)
CO2: 31 mEq/L (ref 19–32)
Chloride: 100 mEq/L (ref 96–112)
Glucose, Bld: 181 mg/dL — ABNORMAL HIGH (ref 70–99)
Potassium: 4.2 mEq/L (ref 3.5–5.1)

## 2012-03-05 LAB — IRON AND TIBC: TIBC: 271 ug/dL (ref 215–435)

## 2012-03-05 LAB — POCT HEMOGLOBIN-HEMACUE: Hemoglobin: 9.4 g/dL — ABNORMAL LOW (ref 13.0–17.0)

## 2012-03-05 MED ORDER — EPOETIN ALFA 10000 UNIT/ML IJ SOLN
30000.0000 [IU] | INTRAMUSCULAR | Status: DC
  Administered 2012-03-05: 30000 [IU] via SUBCUTANEOUS

## 2012-04-01 ENCOUNTER — Other Ambulatory Visit (HOSPITAL_COMMUNITY): Payer: Self-pay | Admitting: *Deleted

## 2012-04-02 ENCOUNTER — Encounter (HOSPITAL_COMMUNITY)
Admission: RE | Admit: 2012-04-02 | Discharge: 2012-04-02 | Disposition: A | Discharge status: Home / Self Care | Payer: Medicare Other | Source: Ambulatory Visit | Attending: Nephrology | Admitting: Nephrology

## 2012-04-02 DIAGNOSIS — D638 Anemia in other chronic diseases classified elsewhere: Secondary | ICD-10-CM | POA: Insufficient documentation

## 2012-04-02 DIAGNOSIS — N184 Chronic kidney disease, stage 4 (severe): Secondary | ICD-10-CM | POA: Insufficient documentation

## 2012-04-02 LAB — RENAL FUNCTION PANEL
Albumin: 3.7 g/dL (ref 3.5–5.2)
BUN: 34 mg/dL — ABNORMAL HIGH (ref 6–23)
Calcium: 9.8 mg/dL (ref 8.4–10.5)
Creatinine, Ser: 1.91 mg/dL — ABNORMAL HIGH (ref 0.50–1.35)
GFR calc non Af Amer: 31 mL/min — ABNORMAL LOW (ref >90)

## 2012-04-02 MED ORDER — EPOETIN ALFA 20000 UNIT/ML IJ SOLN
INTRAMUSCULAR | Status: AC
  Administered 2012-04-02: 20000 [IU] via SUBCUTANEOUS
  Filled 2012-04-02: qty 1

## 2012-04-02 MED ORDER — EPOETIN ALFA 10000 UNIT/ML IJ SOLN
INTRAMUSCULAR | Status: AC
  Filled 2012-04-02: qty 1

## 2012-04-02 MED ORDER — EPOETIN ALFA 10000 UNIT/ML IJ SOLN
30000.0000 [IU] | INTRAMUSCULAR | Status: DC
Start: 2012-04-02 — End: 2012-04-03
  Administered 2012-04-02: 10000 [IU] via SUBCUTANEOUS

## 2012-04-02 MED ORDER — FERUMOXYTOL INJECTION 510 MG/17 ML
510.0000 mg | INTRAVENOUS | Status: DC
  Administered 2012-04-02: 510 mg via INTRAVENOUS
  Filled 2012-04-02: qty 17

## 2012-04-02 MED ORDER — SODIUM CHLORIDE 0.9 % IV SOLN
INTRAVENOUS | Status: DC
  Administered 2012-04-02: 10 mL/h via INTRAVENOUS

## 2012-04-03 LAB — IRON AND TIBC
Iron: 45 ug/dL (ref 42–135)
Saturation Ratios: 16 % — ABNORMAL LOW (ref 20–55)
UIBC: 243 ug/dL (ref 125–400)

## 2012-04-23 IMAGING — CR DG CHEST 2V
2 series · 2 of 2 positions shown · non-contrast
Comparison: Chest x-ray of 12/06/2010

CLINICAL DATA: Pleural effusion, follow-up

CHEST - 2 VIEW

[w chest pa]
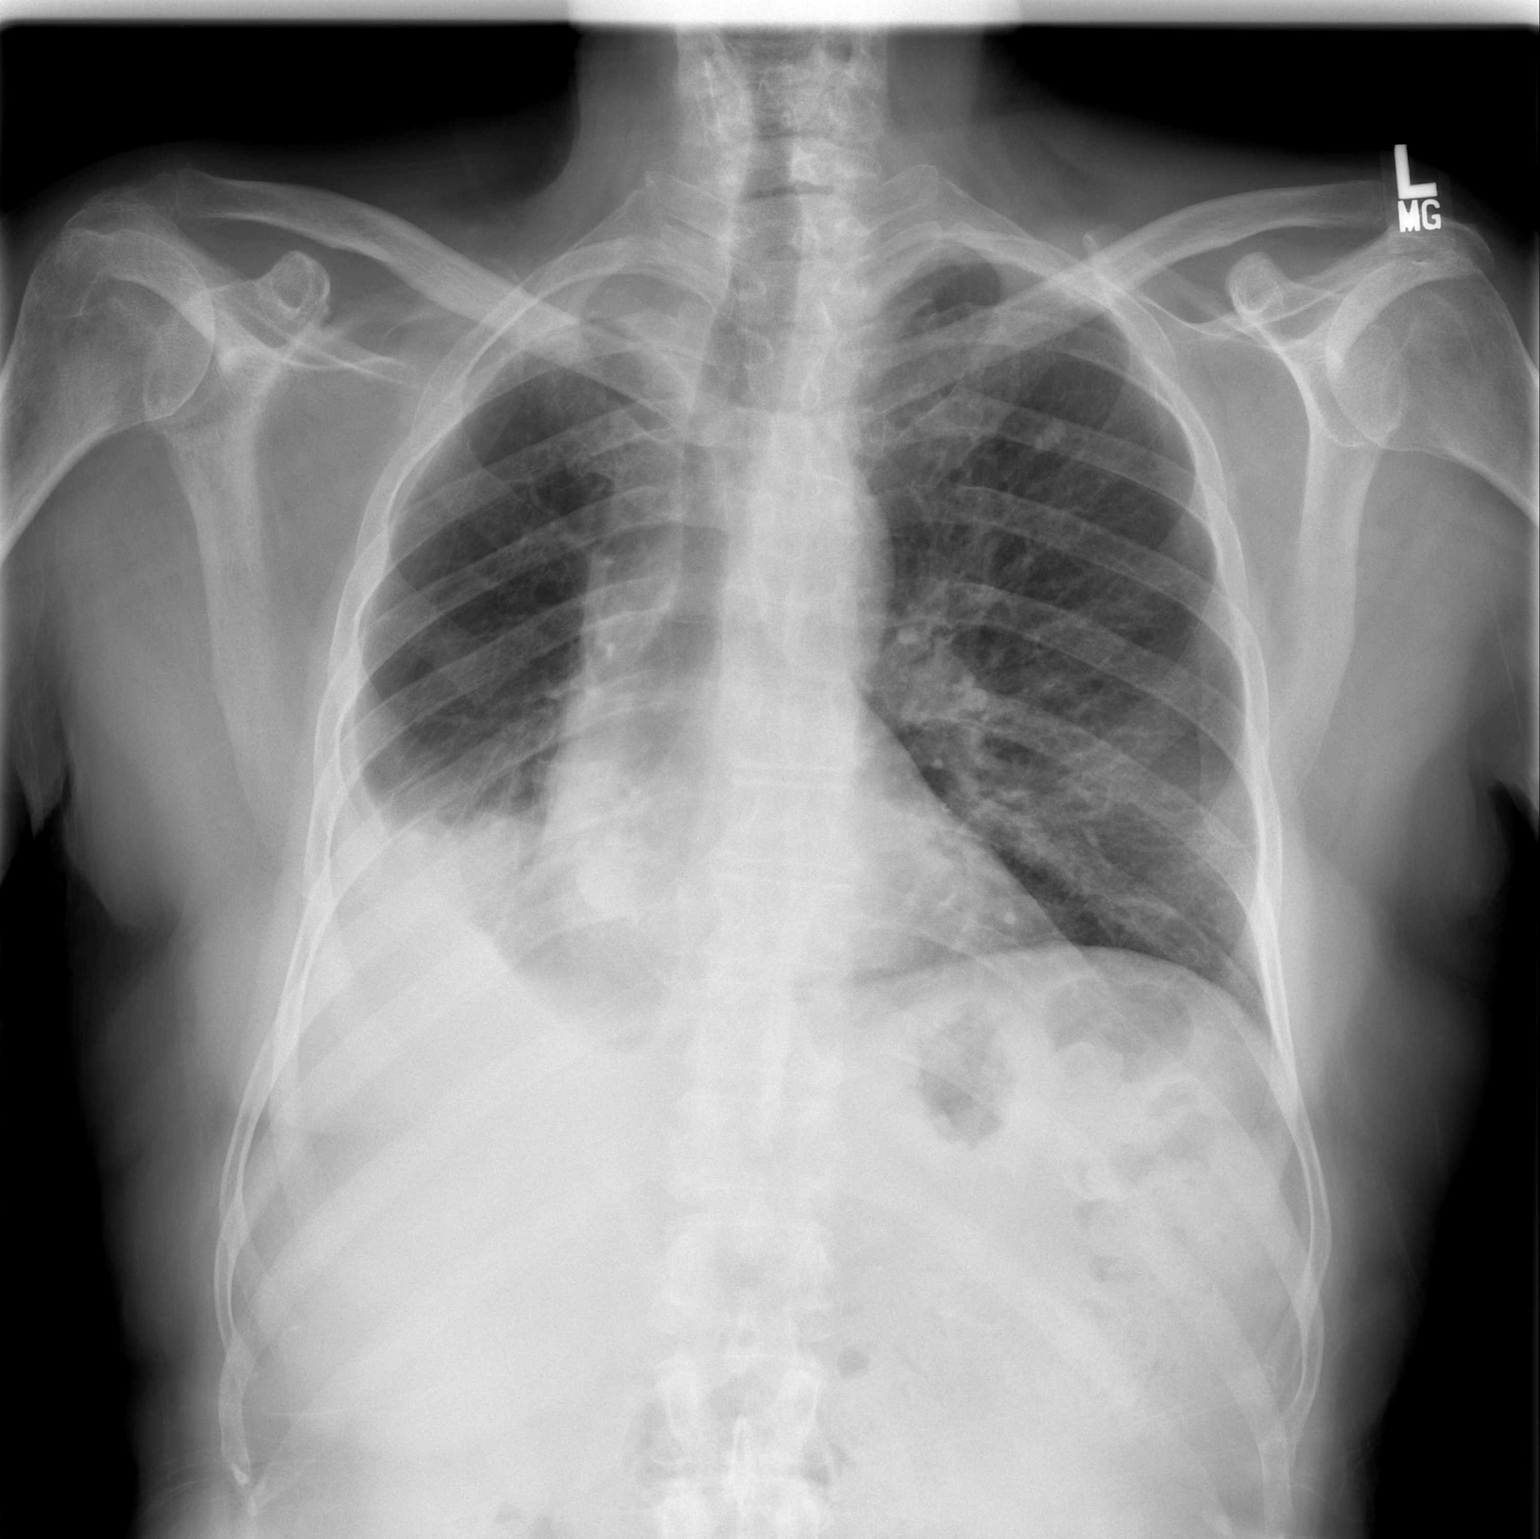

[w chest lat]
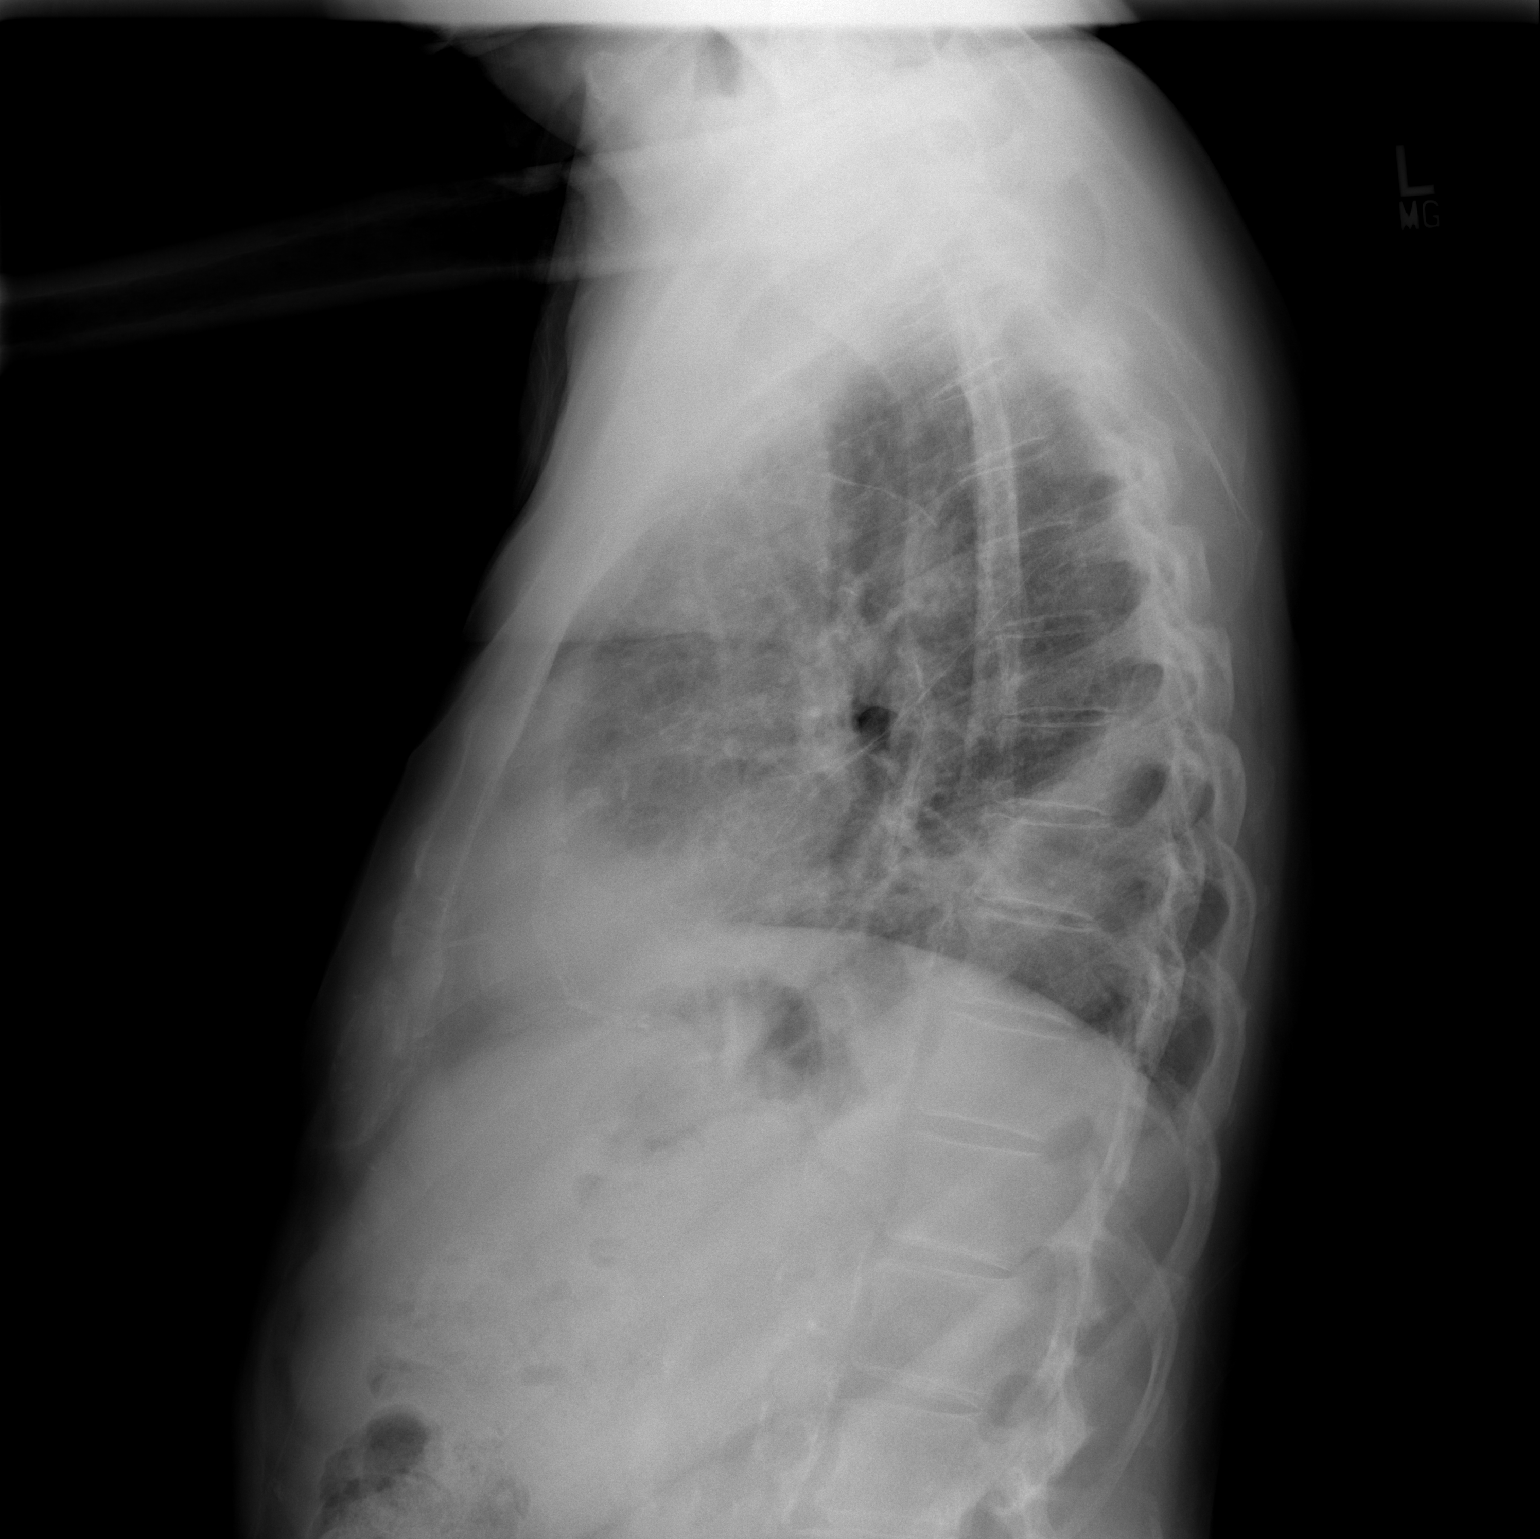

[2 of 2 positions shown; findings below may reference images not displayed]

FINDINGS: Pleural and parenchymal opacity at the right lung base
persist most consistent with effusion possibly loculated and
possible pneumonia. This appears relatively stable compared to
chest x-rays dating back to 7007 and 3777.  Therefore scarring may
account for some of the opacity.  The left lung is clear.  Mild
cardiomegaly is stable.  No bony abnormalities seen.
IMPRESSION: Stable pleural and parenchymal opacity at the posterior right lung
base laterally.

## 2012-04-26 ENCOUNTER — Other Ambulatory Visit (HOSPITAL_COMMUNITY): Payer: Self-pay | Admitting: *Deleted

## 2012-04-30 ENCOUNTER — Encounter (HOSPITAL_COMMUNITY)
Admission: RE | Admit: 2012-04-30 | Discharge: 2012-04-30 | Disposition: A | Discharge status: Home / Self Care | Payer: Medicare Other | Source: Ambulatory Visit | Attending: Nephrology | Admitting: Nephrology

## 2012-04-30 ENCOUNTER — Encounter (HOSPITAL_COMMUNITY): Payer: Medicare Other

## 2012-04-30 DIAGNOSIS — D638 Anemia in other chronic diseases classified elsewhere: Secondary | ICD-10-CM | POA: Insufficient documentation

## 2012-04-30 DIAGNOSIS — N184 Chronic kidney disease, stage 4 (severe): Secondary | ICD-10-CM | POA: Insufficient documentation

## 2012-04-30 LAB — RENAL FUNCTION PANEL
CO2: 28 mEq/L (ref 19–32)
GFR calc Af Amer: 34 mL/min — ABNORMAL LOW (ref >90)
GFR calc non Af Amer: 29 mL/min — ABNORMAL LOW (ref >90)
Glucose, Bld: 155 mg/dL — ABNORMAL HIGH (ref 70–99)
Phosphorus: 2.9 mg/dL (ref 2.3–4.6)
Potassium: 3.8 mEq/L (ref 3.5–5.1)
Sodium: 140 mEq/L (ref 135–145)

## 2012-04-30 LAB — POCT HEMOGLOBIN-HEMACUE: Hemoglobin: 11.6 g/dL — ABNORMAL LOW (ref 13.0–17.0)

## 2012-04-30 LAB — IRON AND TIBC
TIBC: 254 ug/dL (ref 215–435)
UIBC: 216 ug/dL (ref 125–400)

## 2012-04-30 MED ORDER — FERUMOXYTOL INJECTION 510 MG/17 ML
510.0000 mg | INTRAVENOUS | Status: AC
  Administered 2012-04-30: 510 mg via INTRAVENOUS

## 2012-04-30 MED ORDER — EPOETIN ALFA 10000 UNIT/ML IJ SOLN: 30000.0000 [IU] | INTRAMUSCULAR | Status: DC

## 2012-04-30 MED ORDER — SODIUM CHLORIDE 0.9 % IV SOLN
INTRAVENOUS | Status: AC
  Administered 2012-04-30: 250 mL via INTRAVENOUS

## 2012-04-30 MED ORDER — FERUMOXYTOL INJECTION 510 MG/17 ML
INTRAVENOUS | Status: AC
  Administered 2012-04-30: 510 mg via INTRAVENOUS
  Filled 2012-04-30: qty 17

## 2012-05-24 ENCOUNTER — Other Ambulatory Visit (HOSPITAL_COMMUNITY): Payer: Self-pay | Admitting: *Deleted

## 2012-05-28 ENCOUNTER — Encounter (HOSPITAL_COMMUNITY)
Admission: RE | Admit: 2012-05-28 | Discharge: 2012-05-28 | Disposition: A | Discharge status: Home / Self Care | Payer: Medicare Other | Source: Ambulatory Visit | Attending: Nephrology | Admitting: Nephrology

## 2012-05-28 DIAGNOSIS — N184 Chronic kidney disease, stage 4 (severe): Secondary | ICD-10-CM | POA: Insufficient documentation

## 2012-05-28 DIAGNOSIS — D638 Anemia in other chronic diseases classified elsewhere: Secondary | ICD-10-CM | POA: Insufficient documentation

## 2012-06-11 ENCOUNTER — Encounter (HOSPITAL_COMMUNITY): Payer: Medicare Other

## 2012-06-24 ENCOUNTER — Other Ambulatory Visit (HOSPITAL_COMMUNITY): Payer: Self-pay | Admitting: *Deleted

## 2012-06-25 ENCOUNTER — Encounter (HOSPITAL_COMMUNITY)
Admission: RE | Admit: 2012-06-25 | Discharge: 2012-06-25 | Disposition: A | Discharge status: Home / Self Care | Payer: Medicare Other | Source: Ambulatory Visit | Attending: Nephrology | Admitting: Nephrology

## 2012-06-25 LAB — RENAL FUNCTION PANEL
BUN: 25 mg/dL — ABNORMAL HIGH (ref 6–23)
CO2: 31 mEq/L (ref 19–32)
GFR calc Af Amer: 37 mL/min — ABNORMAL LOW (ref >90)
Glucose, Bld: 184 mg/dL — ABNORMAL HIGH (ref 70–99)
Potassium: 3.8 mEq/L (ref 3.5–5.1)
Sodium: 137 mEq/L (ref 135–145)

## 2012-06-25 LAB — IRON AND TIBC: TIBC: 285 ug/dL (ref 215–435)

## 2012-06-25 LAB — POCT HEMOGLOBIN-HEMACUE: Hemoglobin: 11.2 g/dL — ABNORMAL LOW (ref 13.0–17.0)

## 2012-06-25 MED ORDER — EPOETIN ALFA 10000 UNIT/ML IJ SOLN: 30000.0000 [IU] | INTRAMUSCULAR | Status: DC

## 2012-07-03 LAB — FERRITIN: Ferritin: 102 ng/mL (ref 22–322)

## 2012-07-15 ENCOUNTER — Encounter (HOSPITAL_COMMUNITY): Payer: Medicare Other

## 2012-07-22 ENCOUNTER — Other Ambulatory Visit (HOSPITAL_COMMUNITY): Payer: Self-pay | Admitting: *Deleted

## 2012-07-23 ENCOUNTER — Encounter (HOSPITAL_COMMUNITY)
Admission: RE | Admit: 2012-07-23 | Discharge: 2012-07-23 | Disposition: A | Discharge status: Home / Self Care | Payer: Medicare Other | Source: Ambulatory Visit | Attending: Nephrology | Admitting: Nephrology

## 2012-07-23 DIAGNOSIS — N184 Chronic kidney disease, stage 4 (severe): Secondary | ICD-10-CM | POA: Insufficient documentation

## 2012-07-23 DIAGNOSIS — D638 Anemia in other chronic diseases classified elsewhere: Secondary | ICD-10-CM | POA: Insufficient documentation

## 2012-07-23 LAB — RENAL FUNCTION PANEL
CO2: 29 mEq/L (ref 19–32)
Chloride: 100 mEq/L (ref 96–112)
GFR calc Af Amer: 33 mL/min — ABNORMAL LOW (ref >90)
GFR calc non Af Amer: 29 mL/min — ABNORMAL LOW (ref >90)
Potassium: 4.4 mEq/L (ref 3.5–5.1)
Sodium: 141 mEq/L (ref 135–145)

## 2012-07-23 LAB — POCT HEMOGLOBIN-HEMACUE: Hemoglobin: 10.5 g/dL — ABNORMAL LOW (ref 13.0–17.0)

## 2012-07-23 MED ORDER — EPOETIN ALFA 20000 UNIT/ML IJ SOLN
INTRAMUSCULAR | Status: AC
  Administered 2012-07-23: 20000 [IU] via SUBCUTANEOUS
  Filled 2012-07-23: qty 1

## 2012-07-23 MED ORDER — SODIUM CHLORIDE 0.9 % IV SOLN
INTRAVENOUS | Status: DC
  Administered 2012-07-23: 250 mL via INTRAVENOUS

## 2012-07-23 MED ORDER — EPOETIN ALFA 10000 UNIT/ML IJ SOLN
INTRAMUSCULAR | Status: AC
  Administered 2012-07-23: 10000 [IU] via SUBCUTANEOUS
  Filled 2012-07-23: qty 1

## 2012-07-23 MED ORDER — FERUMOXYTOL INJECTION 510 MG/17 ML
510.0000 mg | INTRAVENOUS | Status: DC
  Administered 2012-07-23: 510 mg via INTRAVENOUS

## 2012-07-23 MED ORDER — FERUMOXYTOL INJECTION 510 MG/17 ML
INTRAVENOUS | Status: AC
  Administered 2012-07-23: 510 mg via INTRAVENOUS
  Filled 2012-07-23: qty 17

## 2012-07-23 MED ORDER — EPOETIN ALFA 10000 UNIT/ML IJ SOLN
30000.0000 [IU] | INTRAMUSCULAR | Status: DC
  Administered 2012-07-23: 10000 [IU] via SUBCUTANEOUS

## 2012-07-24 LAB — IRON AND TIBC
Iron: 22 ug/dL — ABNORMAL LOW (ref 42–135)
UIBC: 302 ug/dL (ref 125–400)

## 2012-07-24 LAB — PTH, INTACT AND CALCIUM: Calcium, Total (PTH): 9.9 mg/dL (ref 8.4–10.5)

## 2012-07-30 ENCOUNTER — Encounter (HOSPITAL_COMMUNITY)
Admission: RE | Admit: 2012-07-30 | Discharge: 2012-07-30 | Disposition: A | Discharge status: Home / Self Care | Payer: Medicare Other | Source: Ambulatory Visit | Attending: Nephrology | Admitting: Nephrology

## 2012-07-30 DIAGNOSIS — D638 Anemia in other chronic diseases classified elsewhere: Secondary | ICD-10-CM | POA: Insufficient documentation

## 2012-07-30 DIAGNOSIS — N184 Chronic kidney disease, stage 4 (severe): Secondary | ICD-10-CM | POA: Insufficient documentation

## 2012-07-30 MED ORDER — SODIUM CHLORIDE 0.9 % IV SOLN
INTRAVENOUS | Status: AC
  Administered 2012-07-30: 12:00:00 via INTRAVENOUS

## 2012-07-30 MED ORDER — FERUMOXYTOL INJECTION 510 MG/17 ML
INTRAVENOUS | Status: AC
  Administered 2012-07-30: 510 mg via INTRAVENOUS
  Filled 2012-07-30: qty 17

## 2012-07-30 MED ORDER — FERUMOXYTOL INJECTION 510 MG/17 ML
510.0000 mg | INTRAVENOUS | Status: AC
  Administered 2012-07-30: 510 mg via INTRAVENOUS

## 2012-08-16 ENCOUNTER — Other Ambulatory Visit (HOSPITAL_COMMUNITY): Payer: Self-pay | Admitting: *Deleted

## 2012-08-19 ENCOUNTER — Encounter (HOSPITAL_COMMUNITY)
Admission: RE | Admit: 2012-08-19 | Discharge: 2012-08-19 | Disposition: A | Discharge status: Home / Self Care | Payer: Medicare Other | Source: Ambulatory Visit | Attending: Nephrology | Admitting: Nephrology

## 2012-08-19 LAB — IRON AND TIBC
Saturation Ratios: 20 % (ref 20–55)
TIBC: 256 ug/dL (ref 215–435)
UIBC: 205 ug/dL (ref 125–400)

## 2012-08-19 MED ORDER — EPOETIN ALFA 10000 UNIT/ML IJ SOLN: 30000.0000 [IU] | INTRAMUSCULAR | Status: DC

## 2012-09-16 ENCOUNTER — Other Ambulatory Visit (HOSPITAL_COMMUNITY): Payer: Self-pay | Admitting: *Deleted

## 2012-09-17 ENCOUNTER — Encounter (HOSPITAL_COMMUNITY)
Admission: RE | Admit: 2012-09-17 | Discharge: 2012-09-17 | Disposition: A | Discharge status: Home / Self Care | Payer: Medicare Other | Source: Ambulatory Visit | Attending: Nephrology | Admitting: Nephrology

## 2012-09-17 DIAGNOSIS — D638 Anemia in other chronic diseases classified elsewhere: Secondary | ICD-10-CM | POA: Insufficient documentation

## 2012-09-17 DIAGNOSIS — N184 Chronic kidney disease, stage 4 (severe): Secondary | ICD-10-CM | POA: Insufficient documentation

## 2012-09-17 LAB — IRON AND TIBC
Iron: 41 ug/dL — ABNORMAL LOW (ref 42–135)
TIBC: 256 ug/dL (ref 215–435)

## 2012-09-17 LAB — FERRITIN: Ferritin: 182 ng/mL (ref 22–322)

## 2012-09-17 MED ORDER — EPOETIN ALFA 10000 UNIT/ML IJ SOLN: 30000.0000 [IU] | INTRAMUSCULAR | Status: DC

## 2012-10-14 ENCOUNTER — Other Ambulatory Visit (HOSPITAL_COMMUNITY): Payer: Self-pay | Admitting: *Deleted

## 2012-10-15 ENCOUNTER — Encounter (HOSPITAL_COMMUNITY)
Admission: RE | Admit: 2012-10-15 | Discharge: 2012-10-15 | Disposition: A | Discharge status: Home / Self Care | Payer: Medicare Other | Source: Ambulatory Visit | Attending: Nephrology | Admitting: Nephrology

## 2012-10-15 DIAGNOSIS — D638 Anemia in other chronic diseases classified elsewhere: Secondary | ICD-10-CM | POA: Insufficient documentation

## 2012-10-15 DIAGNOSIS — N184 Chronic kidney disease, stage 4 (severe): Secondary | ICD-10-CM | POA: Insufficient documentation

## 2012-10-15 LAB — RENAL FUNCTION PANEL
Albumin: 3.9 g/dL (ref 3.5–5.2)
BUN: 30 mg/dL — ABNORMAL HIGH (ref 6–23)
Chloride: 100 mEq/L (ref 96–112)
Creatinine, Ser: 2.01 mg/dL — ABNORMAL HIGH (ref 0.50–1.35)
GFR calc non Af Amer: 29 mL/min — ABNORMAL LOW (ref >90)
Phosphorus: 3.7 mg/dL (ref 2.3–4.6)
Potassium: 3.8 mEq/L (ref 3.5–5.1)

## 2012-10-15 LAB — IRON AND TIBC
Saturation Ratios: 13 % — ABNORMAL LOW (ref 20–55)
TIBC: 296 ug/dL (ref 215–435)
UIBC: 259 ug/dL (ref 125–400)

## 2012-10-15 MED ORDER — EPOETIN ALFA 20000 UNIT/ML IJ SOLN
INTRAMUSCULAR | Status: AC
  Administered 2012-10-15: 20000 [IU] via SUBCUTANEOUS
  Filled 2012-10-15: qty 1

## 2012-10-15 MED ORDER — EPOETIN ALFA 10000 UNIT/ML IJ SOLN
INTRAMUSCULAR | Status: AC
  Administered 2012-10-15: 10000 [IU] via SUBCUTANEOUS
  Filled 2012-10-15: qty 1

## 2012-10-15 MED ORDER — EPOETIN ALFA 10000 UNIT/ML IJ SOLN: 30000.0000 [IU] | INTRAMUSCULAR | Status: DC

## 2012-10-15 MED ORDER — FERUMOXYTOL INJECTION 510 MG/17 ML: 510.0000 mg | INTRAVENOUS | Status: DC

## 2012-10-15 MED ORDER — FERUMOXYTOL INJECTION 510 MG/17 ML
INTRAVENOUS | Status: AC
  Administered 2012-10-15: 510 mg
  Filled 2012-10-15: qty 17

## 2012-10-15 MED ORDER — SODIUM CHLORIDE 0.9 % IV SOLN
INTRAVENOUS | Status: DC
Start: 2012-10-15 — End: 2012-10-16

## 2012-10-22 ENCOUNTER — Encounter (HOSPITAL_COMMUNITY)
Admission: RE | Admit: 2012-10-22 | Discharge: 2012-10-22 | Disposition: A | Discharge status: Home / Self Care | Payer: Medicare Other | Source: Ambulatory Visit | Attending: Nephrology | Admitting: Nephrology

## 2012-10-22 MED ORDER — SODIUM CHLORIDE 0.9 % IV SOLN
INTRAVENOUS | Status: AC
  Administered 2012-10-22: 11:00:00 via INTRAVENOUS

## 2012-10-22 MED ORDER — FERUMOXYTOL INJECTION 510 MG/17 ML
INTRAVENOUS | Status: AC
  Filled 2012-10-22: qty 17

## 2012-10-22 MED ORDER — FERUMOXYTOL INJECTION 510 MG/17 ML
510.0000 mg | INTRAVENOUS | Status: AC
  Administered 2012-10-22: 510 mg via INTRAVENOUS

## 2012-11-01 ENCOUNTER — Other Ambulatory Visit (HOSPITAL_COMMUNITY): Payer: Self-pay | Admitting: *Deleted

## 2012-11-04 ENCOUNTER — Encounter (HOSPITAL_COMMUNITY): Payer: Medicare Other

## 2012-11-11 ENCOUNTER — Other Ambulatory Visit (HOSPITAL_COMMUNITY): Payer: Self-pay | Admitting: *Deleted

## 2012-11-12 ENCOUNTER — Inpatient Hospital Stay (HOSPITAL_COMMUNITY): Admission: RE | Admit: 2012-11-12 | Payer: Medicare Other | Source: Ambulatory Visit

## 2012-11-13 ENCOUNTER — Encounter (HOSPITAL_COMMUNITY)
Admission: RE | Admit: 2012-11-13 | Discharge: 2012-11-13 | Disposition: A | Discharge status: Home / Self Care | Payer: Medicare Other | Source: Ambulatory Visit | Attending: Nephrology | Admitting: Nephrology

## 2012-11-13 DIAGNOSIS — N184 Chronic kidney disease, stage 4 (severe): Secondary | ICD-10-CM | POA: Insufficient documentation

## 2012-11-13 DIAGNOSIS — D638 Anemia in other chronic diseases classified elsewhere: Secondary | ICD-10-CM | POA: Insufficient documentation

## 2012-11-13 LAB — POCT HEMOGLOBIN-HEMACUE: Hemoglobin: 11.5 g/dL — ABNORMAL LOW (ref 13.0–17.0)

## 2012-11-13 MED ORDER — EPOETIN ALFA 10000 UNIT/ML IJ SOLN: 30000.0000 [IU] | INTRAMUSCULAR | Status: DC

## 2012-11-14 LAB — IRON AND TIBC: Iron: 45 ug/dL (ref 42–135)

## 2012-12-09 ENCOUNTER — Other Ambulatory Visit (HOSPITAL_COMMUNITY): Payer: Self-pay | Admitting: *Deleted

## 2012-12-10 ENCOUNTER — Encounter (HOSPITAL_COMMUNITY)
Admission: RE | Admit: 2012-12-10 | Discharge: 2012-12-10 | Disposition: A | Discharge status: Home / Self Care | Payer: Medicare Other | Source: Ambulatory Visit | Attending: Nephrology | Admitting: Nephrology

## 2012-12-10 DIAGNOSIS — D638 Anemia in other chronic diseases classified elsewhere: Secondary | ICD-10-CM | POA: Insufficient documentation

## 2012-12-10 DIAGNOSIS — N184 Chronic kidney disease, stage 4 (severe): Secondary | ICD-10-CM | POA: Insufficient documentation

## 2012-12-10 LAB — RENAL FUNCTION PANEL
Albumin: 3.6 g/dL (ref 3.5–5.2)
Calcium: 9.1 mg/dL (ref 8.4–10.5)
Creatinine, Ser: 2.11 mg/dL — ABNORMAL HIGH (ref 0.50–1.35)
GFR calc non Af Amer: 27 mL/min — ABNORMAL LOW (ref >90)

## 2012-12-10 LAB — IRON AND TIBC
Iron: 53 ug/dL (ref 42–135)
Saturation Ratios: 20 % (ref 20–55)
TIBC: 265 ug/dL (ref 215–435)
UIBC: 212 ug/dL (ref 125–400)

## 2012-12-10 MED ORDER — EPOETIN ALFA 20000 UNIT/ML IJ SOLN
INTRAMUSCULAR | Status: AC
  Administered 2012-12-10: 20000 [IU] via SUBCUTANEOUS
  Filled 2012-12-10: qty 1

## 2012-12-10 MED ORDER — EPOETIN ALFA 10000 UNIT/ML IJ SOLN
30000.0000 [IU] | INTRAMUSCULAR | Status: DC
  Administered 2012-12-10: 10000 [IU] via SUBCUTANEOUS

## 2012-12-10 MED ORDER — EPOETIN ALFA 10000 UNIT/ML IJ SOLN
INTRAMUSCULAR | Status: AC
  Filled 2012-12-10: qty 1

## 2012-12-19 ENCOUNTER — Encounter (HOSPITAL_COMMUNITY)
Admission: RE | Admit: 2012-12-19 | Discharge: 2012-12-19 | Disposition: A | Discharge status: Home / Self Care | Payer: Medicare Other | Source: Ambulatory Visit | Attending: Nephrology | Admitting: Nephrology

## 2012-12-19 MED ORDER — FERUMOXYTOL INJECTION 510 MG/17 ML
510.0000 mg | Freq: Once | INTRAVENOUS | Status: AC
  Administered 2012-12-19: 510 mg via INTRAVENOUS

## 2012-12-19 MED ORDER — SODIUM CHLORIDE 0.9 % IV SOLN
INTRAVENOUS | Status: DC
  Administered 2012-12-19: 13:00:00 via INTRAVENOUS

## 2012-12-23 ENCOUNTER — Encounter (HOSPITAL_COMMUNITY)
Admission: RE | Admit: 2012-12-23 | Discharge: 2012-12-23 | Disposition: A | Discharge status: Home / Self Care | Payer: Medicare Other | Source: Ambulatory Visit | Attending: Nephrology | Admitting: Nephrology

## 2012-12-23 MED ORDER — FERUMOXYTOL INJECTION 510 MG/17 ML
INTRAVENOUS | Status: AC
  Filled 2012-12-23: qty 17

## 2012-12-23 MED ORDER — SODIUM CHLORIDE 0.9 % IV SOLN
INTRAVENOUS | Status: DC
  Administered 2012-12-23: 11:00:00 via INTRAVENOUS

## 2012-12-23 MED ORDER — FERUMOXYTOL INJECTION 510 MG/17 ML
510.0000 mg | Freq: Once | INTRAVENOUS | Status: AC
  Administered 2012-12-23: 510 mg via INTRAVENOUS

## 2012-12-31 ENCOUNTER — Encounter (HOSPITAL_COMMUNITY): Payer: Medicare Other

## 2013-01-07 ENCOUNTER — Encounter (HOSPITAL_COMMUNITY)
Admission: RE | Admit: 2013-01-07 | Discharge: 2013-01-07 | Disposition: A | Discharge status: Home / Self Care | Payer: Medicare Other | Source: Ambulatory Visit | Attending: Nephrology | Admitting: Nephrology

## 2013-01-07 DIAGNOSIS — D638 Anemia in other chronic diseases classified elsewhere: Secondary | ICD-10-CM | POA: Insufficient documentation

## 2013-01-07 DIAGNOSIS — N184 Chronic kidney disease, stage 4 (severe): Secondary | ICD-10-CM | POA: Insufficient documentation

## 2013-01-07 LAB — IRON AND TIBC
Iron: 55 ug/dL (ref 42–135)
Saturation Ratios: 23 % (ref 20–55)
TIBC: 241 ug/dL (ref 215–435)

## 2013-01-07 LAB — POCT HEMOGLOBIN-HEMACUE: Hemoglobin: 11.2 g/dL — ABNORMAL LOW (ref 13.0–17.0)

## 2013-01-07 MED ORDER — EPOETIN ALFA 10000 UNIT/ML IJ SOLN: 30000.0000 [IU] | INTRAMUSCULAR | Status: DC

## 2013-01-10 ENCOUNTER — Encounter (HOSPITAL_COMMUNITY): Payer: Medicare Other

## 2013-02-04 ENCOUNTER — Encounter (HOSPITAL_COMMUNITY)
Admission: RE | Admit: 2013-02-04 | Discharge: 2013-02-04 | Disposition: A | Discharge status: Home / Self Care | Payer: Medicare Other | Source: Ambulatory Visit | Attending: Nephrology | Admitting: Nephrology

## 2013-02-04 DIAGNOSIS — N184 Chronic kidney disease, stage 4 (severe): Secondary | ICD-10-CM | POA: Insufficient documentation

## 2013-02-04 DIAGNOSIS — D638 Anemia in other chronic diseases classified elsewhere: Secondary | ICD-10-CM | POA: Insufficient documentation

## 2013-02-04 LAB — POCT HEMOGLOBIN-HEMACUE: Hemoglobin: 11 g/dL — ABNORMAL LOW (ref 13.0–17.0)

## 2013-02-04 LAB — IRON AND TIBC
Iron: 38 ug/dL — ABNORMAL LOW (ref 42–135)
UIBC: 199 ug/dL (ref 125–400)

## 2013-02-04 MED ORDER — EPOETIN ALFA 10000 UNIT/ML IJ SOLN: 30000.0000 [IU] | INTRAMUSCULAR | Status: DC

## 2013-03-03 ENCOUNTER — Other Ambulatory Visit (HOSPITAL_COMMUNITY): Payer: Self-pay | Admitting: *Deleted

## 2013-03-03 ENCOUNTER — Other Ambulatory Visit (HOSPITAL_COMMUNITY): Payer: Self-pay | Admitting: Family Medicine

## 2013-03-03 DIAGNOSIS — M25561 Pain in right knee: Secondary | ICD-10-CM

## 2013-03-03 DIAGNOSIS — M7989 Other specified soft tissue disorders: Secondary | ICD-10-CM

## 2013-03-04 ENCOUNTER — Encounter (HOSPITAL_COMMUNITY)
Admission: RE | Admit: 2013-03-04 | Discharge: 2013-03-04 | Disposition: A | Discharge status: Home / Self Care | Payer: Medicare Other | Source: Ambulatory Visit | Attending: Nephrology | Admitting: Nephrology

## 2013-03-04 ENCOUNTER — Ambulatory Visit (HOSPITAL_COMMUNITY)
Admission: RE | Admit: 2013-03-04 | Discharge: 2013-03-04 | Disposition: A | Discharge status: Home / Self Care | Payer: Medicare Other | Source: Ambulatory Visit | Attending: Family Medicine | Admitting: Family Medicine

## 2013-03-04 DIAGNOSIS — D638 Anemia in other chronic diseases classified elsewhere: Secondary | ICD-10-CM | POA: Insufficient documentation

## 2013-03-04 DIAGNOSIS — N184 Chronic kidney disease, stage 4 (severe): Secondary | ICD-10-CM | POA: Insufficient documentation

## 2013-03-04 DIAGNOSIS — M7989 Other specified soft tissue disorders: Secondary | ICD-10-CM | POA: Insufficient documentation

## 2013-03-04 DIAGNOSIS — M25569 Pain in unspecified knee: Secondary | ICD-10-CM | POA: Insufficient documentation

## 2013-03-04 DIAGNOSIS — M25561 Pain in right knee: Secondary | ICD-10-CM

## 2013-03-04 LAB — IRON AND TIBC: TIBC: 256 ug/dL (ref 215–435)

## 2013-03-04 LAB — RENAL FUNCTION PANEL
CO2: 31 mEq/L (ref 19–32)
Calcium: 9.5 mg/dL (ref 8.4–10.5)
Chloride: 100 mEq/L (ref 96–112)
GFR calc Af Amer: 30 mL/min — ABNORMAL LOW (ref >90)
Glucose, Bld: 306 mg/dL — ABNORMAL HIGH (ref 70–99)
Potassium: 4.9 mEq/L (ref 3.5–5.1)
Sodium: 141 mEq/L (ref 135–145)

## 2013-03-04 LAB — FERRITIN: Ferritin: 106 ng/mL (ref 22–322)

## 2013-03-04 MED ORDER — EPOETIN ALFA 10000 UNIT/ML IJ SOLN: 30000.0000 [IU] | INTRAMUSCULAR | Status: DC

## 2013-03-04 MED ORDER — EPOETIN ALFA 10000 UNIT/ML IJ SOLN
INTRAMUSCULAR | Status: AC
  Administered 2013-03-04: 10000 [IU] via SUBCUTANEOUS
  Filled 2013-03-04: qty 1

## 2013-03-04 MED ORDER — EPOETIN ALFA 20000 UNIT/ML IJ SOLN
INTRAMUSCULAR | Status: AC
  Administered 2013-03-04: 20000 [IU] via SUBCUTANEOUS
  Filled 2013-03-04: qty 1

## 2013-03-04 NOTE — Progress Notes (Signed)
Right lower extremity venous duplex completed.  Right:  No evidence of DVT, superficial thrombosis, or Baker's cyst.  Left:  Negative for DVT in the common femoral vein.  

## 2013-03-05 LAB — PTH, INTACT AND CALCIUM
Calcium, Total (PTH): 9.2 mg/dL (ref 8.4–10.5)
PTH: 99.1 pg/mL — ABNORMAL HIGH (ref 14.0–72.0)

## 2013-03-12 ENCOUNTER — Other Ambulatory Visit (HOSPITAL_COMMUNITY): Payer: Self-pay | Admitting: *Deleted

## 2013-03-13 ENCOUNTER — Encounter (HOSPITAL_COMMUNITY)
Admission: RE | Admit: 2013-03-13 | Discharge: 2013-03-13 | Disposition: A | Discharge status: Home / Self Care | Payer: Medicare Other | Source: Ambulatory Visit | Attending: Nephrology | Admitting: Nephrology

## 2013-03-13 MED ORDER — SODIUM CHLORIDE 0.9 % IV SOLN
1020.0000 mg | Freq: Once | INTRAVENOUS | Status: AC
  Administered 2013-03-13: 1020 mg via INTRAVENOUS
  Filled 2013-03-13: qty 34

## 2013-04-01 ENCOUNTER — Encounter (HOSPITAL_COMMUNITY)
Admission: RE | Admit: 2013-04-01 | Discharge: 2013-04-01 | Disposition: A | Discharge status: Home / Self Care | Payer: Medicare Other | Source: Ambulatory Visit | Attending: Nephrology | Admitting: Nephrology

## 2013-04-01 DIAGNOSIS — N184 Chronic kidney disease, stage 4 (severe): Secondary | ICD-10-CM | POA: Insufficient documentation

## 2013-04-01 DIAGNOSIS — D638 Anemia in other chronic diseases classified elsewhere: Secondary | ICD-10-CM | POA: Insufficient documentation

## 2013-04-01 LAB — IRON AND TIBC: UIBC: 176 ug/dL (ref 125–400)

## 2013-04-01 LAB — POCT HEMOGLOBIN-HEMACUE: Hemoglobin: 11.1 g/dL — ABNORMAL LOW (ref 13.0–17.0)

## 2013-04-01 MED ORDER — EPOETIN ALFA 10000 UNIT/ML IJ SOLN: 30000.0000 [IU] | INTRAMUSCULAR | Status: DC

## 2013-04-29 ENCOUNTER — Encounter (HOSPITAL_COMMUNITY)
Admission: RE | Admit: 2013-04-29 | Discharge: 2013-04-29 | Disposition: A | Discharge status: Home / Self Care | Payer: Medicare Other | Source: Ambulatory Visit | Attending: Nephrology | Admitting: Nephrology

## 2013-04-29 DIAGNOSIS — D638 Anemia in other chronic diseases classified elsewhere: Secondary | ICD-10-CM | POA: Insufficient documentation

## 2013-04-29 DIAGNOSIS — N184 Chronic kidney disease, stage 4 (severe): Secondary | ICD-10-CM | POA: Insufficient documentation

## 2013-04-29 LAB — IRON AND TIBC
Iron: 35 ug/dL — ABNORMAL LOW (ref 42–135)
Saturation Ratios: 15 % — ABNORMAL LOW (ref 20–55)
TIBC: 234 ug/dL (ref 215–435)

## 2013-04-29 MED ORDER — EPOETIN ALFA 10000 UNIT/ML IJ SOLN
INTRAMUSCULAR | Status: AC
  Administered 2013-04-29: 10000 [IU] via SUBCUTANEOUS
  Filled 2013-04-29: qty 1

## 2013-04-29 MED ORDER — EPOETIN ALFA 20000 UNIT/ML IJ SOLN
INTRAMUSCULAR | Status: AC
  Administered 2013-04-29: 20000 [IU] via SUBCUTANEOUS
  Filled 2013-04-29: qty 1

## 2013-04-29 MED ORDER — EPOETIN ALFA 10000 UNIT/ML IJ SOLN: 30000.0000 [IU] | INTRAMUSCULAR | Status: DC

## 2013-05-23 ENCOUNTER — Other Ambulatory Visit (HOSPITAL_COMMUNITY): Payer: Self-pay | Admitting: *Deleted

## 2013-05-27 ENCOUNTER — Encounter (HOSPITAL_COMMUNITY)
Admission: RE | Admit: 2013-05-27 | Discharge: 2013-05-27 | Disposition: A | Discharge status: Home / Self Care | Payer: Medicare Other | Source: Ambulatory Visit | Attending: Nephrology | Admitting: Nephrology

## 2013-05-27 LAB — POCT HEMOGLOBIN-HEMACUE: Hemoglobin: 10.3 g/dL — ABNORMAL LOW (ref 13.0–17.0)

## 2013-05-27 LAB — IRON AND TIBC: UIBC: 240 ug/dL (ref 125–400)

## 2013-05-27 LAB — VITAMIN B12: Vitamin B-12: 534 pg/mL (ref 211–911)

## 2013-05-27 LAB — FERRITIN: Ferritin: 83 ng/mL (ref 22–322)

## 2013-05-27 LAB — FOLATE: Folate: 19.7 ng/mL

## 2013-05-27 MED ORDER — SODIUM CHLORIDE 0.9 % IV SOLN
1020.0000 mg | Freq: Once | INTRAVENOUS | Status: AC
  Administered 2013-05-27: 1020 mg via INTRAVENOUS
  Filled 2013-05-27: qty 34

## 2013-05-27 MED ORDER — EPOETIN ALFA 10000 UNIT/ML IJ SOLN
30000.0000 [IU] | INTRAMUSCULAR | Status: DC
  Administered 2013-05-27: 10000 [IU] via SUBCUTANEOUS

## 2013-05-27 MED ORDER — EPOETIN ALFA 20000 UNIT/ML IJ SOLN
INTRAMUSCULAR | Status: AC
  Administered 2013-05-27: 13:00:00 20000 [IU] via SUBCUTANEOUS
  Filled 2013-05-27: qty 1

## 2013-06-23 ENCOUNTER — Other Ambulatory Visit (HOSPITAL_COMMUNITY): Payer: Self-pay | Admitting: *Deleted

## 2013-06-24 ENCOUNTER — Encounter (HOSPITAL_COMMUNITY): Payer: Self-pay | Admitting: Emergency Medicine

## 2013-06-24 ENCOUNTER — Encounter (HOSPITAL_COMMUNITY)
Admission: RE | Admit: 2013-06-24 | Discharge: 2013-06-24 | Disposition: A | Discharge status: Home / Self Care | Payer: Medicare Other | Source: Ambulatory Visit | Attending: Nephrology | Admitting: Nephrology

## 2013-06-24 ENCOUNTER — Emergency Department (HOSPITAL_COMMUNITY)
Admission: EM | Admit: 2013-06-24 | Discharge: 2013-06-24 | Disposition: A | Discharge status: Home / Self Care | Payer: Medicare Other | Source: Home / Self Care | Attending: Family Medicine | Admitting: Family Medicine

## 2013-06-24 DIAGNOSIS — IMO0002 Reserved for concepts with insufficient information to code with codable children: Secondary | ICD-10-CM

## 2013-06-24 DIAGNOSIS — W010XXA Fall on same level from slipping, tripping and stumbling without subsequent striking against object, initial encounter: Secondary | ICD-10-CM

## 2013-06-24 DIAGNOSIS — N184 Chronic kidney disease, stage 4 (severe): Secondary | ICD-10-CM | POA: Insufficient documentation

## 2013-06-24 DIAGNOSIS — Z23 Encounter for immunization: Secondary | ICD-10-CM

## 2013-06-24 DIAGNOSIS — D638 Anemia in other chronic diseases classified elsewhere: Secondary | ICD-10-CM | POA: Insufficient documentation

## 2013-06-24 DIAGNOSIS — T07XXXA Unspecified multiple injuries, initial encounter: Secondary | ICD-10-CM

## 2013-06-24 HISTORY — DX: Cerebral infarction, unspecified: I63.9

## 2013-06-24 HISTORY — DX: Atherosclerotic heart disease of native coronary artery without angina pectoris: I25.10

## 2013-06-24 HISTORY — DX: Essential (primary) hypertension: I10

## 2013-06-24 LAB — RENAL FUNCTION PANEL
Albumin: 3.8 g/dL (ref 3.5–5.2)
BUN: 31 mg/dL — ABNORMAL HIGH (ref 6–23)
Calcium: 9.4 mg/dL (ref 8.4–10.5)
Creatinine, Ser: 1.91 mg/dL — ABNORMAL HIGH (ref 0.50–1.35)
Glucose, Bld: 171 mg/dL — ABNORMAL HIGH (ref 70–99)
Phosphorus: 3.9 mg/dL (ref 2.3–4.6)
Potassium: 3.9 mEq/L (ref 3.5–5.1)
Sodium: 139 mEq/L (ref 135–145)

## 2013-06-24 LAB — FERRITIN: Ferritin: 708 ng/mL — ABNORMAL HIGH (ref 22–322)

## 2013-06-24 LAB — IRON AND TIBC
Iron: 61 ug/dL (ref 42–135)
Saturation Ratios: 27 % (ref 20–55)
TIBC: 225 ug/dL (ref 215–435)
UIBC: 164 ug/dL (ref 125–400)

## 2013-06-24 MED ORDER — EPOETIN ALFA 20000 UNIT/ML IJ SOLN
INTRAMUSCULAR | Status: AC
  Administered 2013-06-24: 20000 [IU] via SUBCUTANEOUS
  Filled 2013-06-24: qty 1

## 2013-06-24 MED ORDER — EPOETIN ALFA 10000 UNIT/ML IJ SOLN: 30000.0000 [IU] | INTRAMUSCULAR | Status: DC

## 2013-06-24 MED ORDER — TETANUS-DIPHTH-ACELL PERTUSSIS 5-2.5-18.5 LF-MCG/0.5 IM SUSP
0.5000 mL | Freq: Once | INTRAMUSCULAR | Status: AC
  Administered 2013-06-24: 0.5 mL via INTRAMUSCULAR

## 2013-06-24 MED ORDER — EPOETIN ALFA 10000 UNIT/ML IJ SOLN
INTRAMUSCULAR | Status: AC
  Administered 2013-06-24: 10000 [IU] via SUBCUTANEOUS
  Filled 2013-06-24: qty 1

## 2013-06-24 MED ORDER — TETANUS-DIPHTH-ACELL PERTUSSIS 5-2.5-18.5 LF-MCG/0.5 IM SUSP
INTRAMUSCULAR | Status: AC
  Filled 2013-06-24: qty 0.5

## 2013-06-24 NOTE — ED Notes (Signed)
Pt  Stumbled  Over  A  Curb     And  Felled         He  Did  Not  Black  Out  And    He has  A  Laceration r small  Finger  r  Elbow  And  r  Knee       Bleeding  Has  Subsided  Unknown  As  To  Last  Tet  Shot

## 2013-06-24 NOTE — ED Provider Notes (Signed)
CSN: [id number]     Arrival date & time 06/24/13  1407 History   First MD Initiated Contact with Patient 06/24/13 1550     Chief Complaint  Patient presents with  . Fall   (Consider location/radiation/quality/duration/timing/severity/associated sxs/prior Treatment) Patient is a 79 y.o. male presenting with fall. The history is provided by the patient and a relative.  Fall This is a new problem. The current episode started 1 to 2 hours ago (fell over curb with mult lacs and abrasions , no bony pain or injury.). The problem has not changed since onset.Pertinent negatives include no chest pain and no abdominal pain.    Past Medical History  Diagnosis Date  . Hypertension   . Stroke   . Diabetes mellitus without complication   . Coronary artery disease    Past Surgical History  Procedure Laterality Date  . Stints     History reviewed. No pertinent family history. History  Substance Use Topics  . Smoking status: Never Smoker   . Smokeless tobacco: Not on file  . Alcohol Use: No    Review of Systems  Constitutional: Negative.   Cardiovascular: Negative for chest pain.  Gastrointestinal: Negative for abdominal pain.  Skin: Positive for wound.    Allergies  Shellfish allergy  Home Medications   Current Outpatient Rx  Name  Route  Sig  Dispense  Refill  . dipyridamole-aspirin (AGGRENOX) 200-25 MG per 12 hr capsule   Oral   Take 1 capsule by mouth 2 (two) times daily.         . finasteride (PROSCAR) 5 MG tablet   Oral   Take 5 mg by mouth daily.         . Furosemide (LASIX PO)   Oral   Take by mouth.         . insulin NPH (NOVOLIN N) 100 UNIT/ML injection   Subcutaneous   Inject into the skin.         Marland Kitchen Omeprazole (PRILOSEC PO)   Oral   Take by mouth.         Marland Kitchen PREDNISONE PO   Oral   Take by mouth.          BP 149/77  Pulse 110  Temp(Src) 98.1 F (36.7 C) (Oral)  Resp 20  SpO2 98% Physical Exam  Nursing note and vitals  reviewed. Constitutional: He appears well-developed and well-nourished.  Musculoskeletal: Normal range of motion.  Neurological: He is alert.  Skin: Skin is warm and dry.  Skin flap to right elbow debrided and dsg applied, lac to right 5th finger steristrips applied, abrasion to right knee washed.    ED Course  Procedures (including critical care time) Labs Review Labs Reviewed - No data to display Imaging Review No results found.    MDM      Linna Hoff, MD 06/24/13 [phone]

## 2013-07-22 ENCOUNTER — Encounter (HOSPITAL_COMMUNITY)
Admission: RE | Admit: 2013-07-22 | Discharge: 2013-07-22 | Disposition: A | Discharge status: Home / Self Care | Payer: Medicare Other | Source: Ambulatory Visit | Attending: Nephrology | Admitting: Nephrology

## 2013-07-22 DIAGNOSIS — D638 Anemia in other chronic diseases classified elsewhere: Secondary | ICD-10-CM | POA: Insufficient documentation

## 2013-07-22 DIAGNOSIS — N184 Chronic kidney disease, stage 4 (severe): Secondary | ICD-10-CM | POA: Insufficient documentation

## 2013-07-22 LAB — IRON AND TIBC
Iron: 50 ug/dL (ref 42–135)
Saturation Ratios: 20 % (ref 20–55)
TIBC: 246 ug/dL (ref 215–435)
UIBC: 196 ug/dL (ref 125–400)

## 2013-07-22 LAB — FERRITIN: Ferritin: 282 ng/mL (ref 22–322)

## 2013-07-22 LAB — POCT HEMOGLOBIN-HEMACUE: Hemoglobin: 9.3 g/dL — ABNORMAL LOW (ref 13.0–17.0)

## 2013-07-22 MED ORDER — EPOETIN ALFA 10000 UNIT/ML IJ SOLN
30000.0000 [IU] | INTRAMUSCULAR | Status: DC
  Administered 2013-07-22: 10000 [IU] via SUBCUTANEOUS

## 2013-07-22 MED ORDER — EPOETIN ALFA 20000 UNIT/ML IJ SOLN
INTRAMUSCULAR | Status: AC
  Administered 2013-07-22: 20000 [IU] via SUBCUTANEOUS
  Filled 2013-07-22: qty 1

## 2013-07-22 MED ORDER — EPOETIN ALFA 10000 UNIT/ML IJ SOLN
INTRAMUSCULAR | Status: AC
  Administered 2013-07-22: 10000 [IU] via SUBCUTANEOUS
  Filled 2013-07-22: qty 1

## 2013-08-06 ENCOUNTER — Other Ambulatory Visit (HOSPITAL_COMMUNITY): Payer: Self-pay | Admitting: *Deleted

## 2013-08-07 ENCOUNTER — Encounter (HOSPITAL_COMMUNITY)
Admission: RE | Admit: 2013-08-07 | Discharge: 2013-08-07 | Disposition: A | Discharge status: Home / Self Care | Payer: Medicare Other | Source: Ambulatory Visit | Attending: Nephrology | Admitting: Nephrology

## 2013-08-07 DIAGNOSIS — N184 Chronic kidney disease, stage 4 (severe): Secondary | ICD-10-CM | POA: Insufficient documentation

## 2013-08-07 DIAGNOSIS — D638 Anemia in other chronic diseases classified elsewhere: Secondary | ICD-10-CM | POA: Insufficient documentation

## 2013-08-07 MED ORDER — EPOETIN ALFA 10000 UNIT/ML IJ SOLN: 40000.0000 [IU] | INTRAMUSCULAR | Status: DC

## 2013-08-07 MED ORDER — SODIUM CHLORIDE 0.9 % IV SOLN: 1020.0000 mg | Freq: Once | INTRAVENOUS | Status: DC

## 2013-08-07 MED ORDER — SODIUM CHLORIDE 0.9 % IV SOLN
1020.0000 mg | Freq: Once | INTRAVENOUS | Status: DC
  Filled 2013-08-07: qty 34

## 2013-08-07 MED ORDER — SODIUM CHLORIDE 0.9 % IV SOLN
1020.0000 mg | Freq: Once | INTRAVENOUS | Status: AC
  Administered 2013-08-07: 12:00:00 1020 mg via INTRAVENOUS
  Filled 2013-08-07: qty 34

## 2013-08-19 ENCOUNTER — Encounter (HOSPITAL_COMMUNITY): Payer: Self-pay | Admitting: Emergency Medicine

## 2013-08-19 ENCOUNTER — Encounter (HOSPITAL_COMMUNITY)
Admission: RE | Admit: 2013-08-19 | Discharge: 2013-08-19 | Disposition: A | Discharge status: Home / Self Care | Payer: Medicare Other | Source: Ambulatory Visit | Attending: Nephrology | Admitting: Nephrology

## 2013-08-19 ENCOUNTER — Observation Stay (HOSPITAL_COMMUNITY)
Admission: EM | Admit: 2013-08-19 | Discharge: 2013-08-20 | Disposition: A | Discharge status: Home / Self Care | Payer: Medicare Other | Attending: Internal Medicine | Admitting: Internal Medicine

## 2013-08-19 ENCOUNTER — Other Ambulatory Visit: Payer: Self-pay

## 2013-08-19 ENCOUNTER — Emergency Department (HOSPITAL_COMMUNITY): Payer: Medicare Other

## 2013-08-19 ENCOUNTER — Encounter (HOSPITAL_COMMUNITY): Payer: Medicare Other

## 2013-08-19 DIAGNOSIS — IMO0002 Reserved for concepts with insufficient information to code with codable children: Secondary | ICD-10-CM | POA: Insufficient documentation

## 2013-08-19 DIAGNOSIS — Z79899 Other long term (current) drug therapy: Secondary | ICD-10-CM | POA: Insufficient documentation

## 2013-08-19 DIAGNOSIS — I252 Old myocardial infarction: Secondary | ICD-10-CM | POA: Insufficient documentation

## 2013-08-19 DIAGNOSIS — Z8673 Personal history of transient ischemic attack (TIA), and cerebral infarction without residual deficits: Secondary | ICD-10-CM | POA: Insufficient documentation

## 2013-08-19 DIAGNOSIS — R079 Chest pain, unspecified: Principal | ICD-10-CM | POA: Insufficient documentation

## 2013-08-19 DIAGNOSIS — I3139 Other pericardial effusion (noninflammatory): Secondary | ICD-10-CM | POA: Diagnosis present

## 2013-08-19 DIAGNOSIS — I129 Hypertensive chronic kidney disease with stage 1 through stage 4 chronic kidney disease, or unspecified chronic kidney disease: Secondary | ICD-10-CM | POA: Insufficient documentation

## 2013-08-19 DIAGNOSIS — N184 Chronic kidney disease, stage 4 (severe): Secondary | ICD-10-CM

## 2013-08-19 DIAGNOSIS — Z9861 Coronary angioplasty status: Secondary | ICD-10-CM | POA: Insufficient documentation

## 2013-08-19 DIAGNOSIS — R231 Pallor: Secondary | ICD-10-CM | POA: Insufficient documentation

## 2013-08-19 DIAGNOSIS — D631 Anemia in chronic kidney disease: Secondary | ICD-10-CM | POA: Insufficient documentation

## 2013-08-19 DIAGNOSIS — Z7982 Long term (current) use of aspirin: Secondary | ICD-10-CM | POA: Insufficient documentation

## 2013-08-19 DIAGNOSIS — N183 Chronic kidney disease, stage 3 unspecified: Secondary | ICD-10-CM | POA: Insufficient documentation

## 2013-08-19 DIAGNOSIS — J449 Chronic obstructive pulmonary disease, unspecified: Secondary | ICD-10-CM | POA: Insufficient documentation

## 2013-08-19 DIAGNOSIS — Z794 Long term (current) use of insulin: Secondary | ICD-10-CM | POA: Insufficient documentation

## 2013-08-19 DIAGNOSIS — D649 Anemia, unspecified: Secondary | ICD-10-CM

## 2013-08-19 DIAGNOSIS — I2510 Atherosclerotic heart disease of native coronary artery without angina pectoris: Secondary | ICD-10-CM | POA: Insufficient documentation

## 2013-08-19 DIAGNOSIS — I10 Essential (primary) hypertension: Secondary | ICD-10-CM | POA: Diagnosis present

## 2013-08-19 DIAGNOSIS — J90 Pleural effusion, not elsewhere classified: Secondary | ICD-10-CM | POA: Insufficient documentation

## 2013-08-19 DIAGNOSIS — I313 Pericardial effusion (noninflammatory): Secondary | ICD-10-CM | POA: Diagnosis present

## 2013-08-19 DIAGNOSIS — K7469 Other cirrhosis of liver: Secondary | ICD-10-CM

## 2013-08-19 DIAGNOSIS — J4489 Other specified chronic obstructive pulmonary disease: Secondary | ICD-10-CM | POA: Insufficient documentation

## 2013-08-19 DIAGNOSIS — E119 Type 2 diabetes mellitus without complications: Secondary | ICD-10-CM | POA: Insufficient documentation

## 2013-08-19 HISTORY — DX: Acute myocardial infarction, unspecified: I21.9

## 2013-08-19 HISTORY — DX: Pleural effusion, not elsewhere classified: J90

## 2013-08-19 HISTORY — DX: Anemia in chronic kidney disease: D63.1

## 2013-08-19 HISTORY — DX: Chronic kidney disease, stage 3 unspecified: N18.30

## 2013-08-19 HISTORY — DX: Family history of other specified conditions: Z84.89

## 2013-08-19 HISTORY — DX: Other cirrhosis of liver: K74.69

## 2013-08-19 HISTORY — DX: Other pericardial effusion (noninflammatory): I31.39

## 2013-08-19 HISTORY — DX: Personal history of transient ischemic attack (TIA), and cerebral infarction without residual deficits: Z86.73

## 2013-08-19 HISTORY — DX: Unspecified osteoarthritis, unspecified site: M19.90

## 2013-08-19 HISTORY — DX: Personal history of other medical treatment: Z92.89

## 2013-08-19 HISTORY — DX: Type 2 diabetes mellitus without complications: E11.9

## 2013-08-19 HISTORY — DX: Chronic kidney disease, stage 3 (moderate): N18.3

## 2013-08-19 HISTORY — DX: Depression, unspecified: F32.A

## 2013-08-19 HISTORY — DX: Other pulmonary collapse: J98.19

## 2013-08-19 HISTORY — DX: Chronic obstructive pulmonary disease, unspecified: J44.9

## 2013-08-19 HISTORY — DX: Shortness of breath: R06.02

## 2013-08-19 HISTORY — DX: Major depressive disorder, single episode, unspecified: F32.9

## 2013-08-19 HISTORY — DX: Pericardial effusion (noninflammatory): I31.3

## 2013-08-19 HISTORY — DX: Gout, unspecified: M10.9

## 2013-08-19 HISTORY — DX: Chronic kidney disease, stage 4 (severe): N18.4

## 2013-08-19 LAB — IRON AND TIBC
Iron: 47 ug/dL (ref 42–135)
Saturation Ratios: 22 % (ref 20–55)
TIBC: 218 ug/dL (ref 215–435)
UIBC: 171 ug/dL (ref 125–400)

## 2013-08-19 LAB — BASIC METABOLIC PANEL
BUN: 32 mg/dL — ABNORMAL HIGH (ref 6–23)
CO2: 28 mEq/L (ref 19–32)
Calcium: 8.9 mg/dL (ref 8.4–10.5)
Chloride: 100 mEq/L (ref 96–112)
Creatinine, Ser: 1.84 mg/dL — ABNORMAL HIGH (ref 0.50–1.35)
GFR calc Af Amer: 37 mL/min — ABNORMAL LOW (ref >90)
Sodium: 140 mEq/L (ref 135–145)

## 2013-08-19 LAB — RETICULOCYTES
RBC.: 2.8 MIL/uL — ABNORMAL LOW (ref 4.22–5.81)
Retic Ct Pct: 3.4 % — ABNORMAL HIGH (ref 0.4–3.1)

## 2013-08-19 LAB — GLUCOSE, CAPILLARY: Glucose-Capillary: 202 mg/dL — ABNORMAL HIGH (ref 70–99)

## 2013-08-19 LAB — CBC
HCT: 28.6 % — ABNORMAL LOW (ref 39.0–52.0)
MCH: 29.7 pg (ref 26.0–34.0)
MCHC: 29.7 g/dL — ABNORMAL LOW (ref 30.0–36.0)
MCV: 100 fL (ref 78.0–100.0)
Platelets: 156 10*3/uL (ref 150–400)
RBC: 2.86 MIL/uL — ABNORMAL LOW (ref 4.22–5.81)
RDW: 23.4 % — ABNORMAL HIGH (ref 11.5–15.5)
WBC: 5.6 10*3/uL (ref 4.0–10.5)

## 2013-08-19 LAB — PREPARE RBC (CROSSMATCH)

## 2013-08-19 LAB — FERRITIN: Ferritin: 1242 ng/mL — ABNORMAL HIGH (ref 22–322)

## 2013-08-19 LAB — MAGNESIUM: Magnesium: 2.1 mg/dL (ref 1.5–2.5)

## 2013-08-19 MED ORDER — MORPHINE SULFATE 2 MG/ML IJ SOLN: 2.0000 mg | INTRAMUSCULAR | Status: DC | PRN

## 2013-08-19 MED ORDER — NITROGLYCERIN 0.4 MG SL SUBL: 0.4000 mg | SUBLINGUAL_TABLET | SUBLINGUAL | Status: DC | PRN

## 2013-08-19 MED ORDER — ACETAMINOPHEN 325 MG PO TABS: 650.0000 mg | ORAL_TABLET | ORAL | Status: DC | PRN

## 2013-08-19 MED ORDER — FUROSEMIDE 40 MG PO TABS
40.0000 mg | ORAL_TABLET | Freq: Every day | ORAL | Status: DC
  Administered 2013-08-20: 40 mg via ORAL
  Filled 2013-08-19: qty 1

## 2013-08-19 MED ORDER — INSULIN ASPART 100 UNIT/ML ~~LOC~~ SOLN: 0.0000 [IU] | Freq: Three times a day (TID) | SUBCUTANEOUS | Status: DC

## 2013-08-19 MED ORDER — FINASTERIDE 5 MG PO TABS
5.0000 mg | ORAL_TABLET | Freq: Every day | ORAL | Status: DC
  Administered 2013-08-20: 5 mg via ORAL
  Filled 2013-08-19: qty 1

## 2013-08-19 MED ORDER — EPOETIN ALFA 10000 UNIT/ML IJ SOLN: 40000.0000 [IU] | INTRAMUSCULAR | Status: DC

## 2013-08-19 MED ORDER — LATANOPROST 0.005 % OP SOLN
1.0000 [drp] | Freq: Every day | OPHTHALMIC | Status: DC
  Administered 2013-08-19: 1 [drp] via OPHTHALMIC
  Filled 2013-08-19: qty 2.5

## 2013-08-19 MED ORDER — METOPROLOL TARTRATE 12.5 MG HALF TABLET
12.5000 mg | ORAL_TABLET | Freq: Two times a day (BID) | ORAL | Status: DC
  Administered 2013-08-19 – 2013-08-20 (×2): 12.5 mg via ORAL
  Filled 2013-08-19 (×3): qty 1

## 2013-08-19 MED ORDER — INSULIN NPH (HUMAN) (ISOPHANE) 100 UNIT/ML ~~LOC~~ SUSP
6.0000 [IU] | Freq: Two times a day (BID) | SUBCUTANEOUS | Status: DC
  Administered 2013-08-19: 12 [IU] via SUBCUTANEOUS
  Administered 2013-08-20: 6 [IU] via SUBCUTANEOUS
  Filled 2013-08-19: qty 10

## 2013-08-19 MED ORDER — HYDROXYZINE HCL 25 MG PO TABS
25.0000 mg | ORAL_TABLET | Freq: Four times a day (QID) | ORAL | Status: DC | PRN
  Filled 2013-08-19: qty 1

## 2013-08-19 MED ORDER — DOXEPIN HCL 10 MG PO CAPS
20.0000 mg | ORAL_CAPSULE | Freq: Every day | ORAL | Status: DC
  Administered 2013-08-19: 20 mg via ORAL
  Filled 2013-08-19 (×2): qty 2

## 2013-08-19 MED ORDER — ONDANSETRON HCL 4 MG/2ML IJ SOLN: 4.0000 mg | Freq: Four times a day (QID) | INTRAMUSCULAR | Status: DC | PRN

## 2013-08-19 MED ORDER — VITAMIN D3 25 MCG (1000 UNIT) PO TABS
1000.0000 [IU] | ORAL_TABLET | Freq: Every day | ORAL | Status: DC
  Administered 2013-08-20: 1000 [IU] via ORAL
  Filled 2013-08-19: qty 1

## 2013-08-19 MED ORDER — ASPIRIN-DIPYRIDAMOLE ER 25-200 MG PO CP12
1.0000 | ORAL_CAPSULE | Freq: Two times a day (BID) | ORAL | Status: DC
  Administered 2013-08-19 – 2013-08-20 (×2): 1 via ORAL
  Filled 2013-08-19 (×3): qty 1

## 2013-08-19 MED ORDER — ENOXAPARIN SODIUM 30 MG/0.3ML ~~LOC~~ SOLN
30.0000 mg | SUBCUTANEOUS | Status: DC
  Administered 2013-08-19: 30 mg via SUBCUTANEOUS
  Filled 2013-08-19 (×2): qty 0.3

## 2013-08-19 MED ORDER — EPOETIN ALFA 40000 UNIT/ML IJ SOLN
INTRAMUSCULAR | Status: AC
  Administered 2013-08-19: 40000 [IU] via SUBCUTANEOUS
  Filled 2013-08-19: qty 1

## 2013-08-19 MED ORDER — INSULIN ASPART 100 UNIT/ML ~~LOC~~ SOLN
0.0000 [IU] | Freq: Every day | SUBCUTANEOUS | Status: DC
  Administered 2013-08-19: 2 [IU] via SUBCUTANEOUS

## 2013-08-19 MED ORDER — ASPIRIN EC 81 MG PO TBEC
81.0000 mg | DELAYED_RELEASE_TABLET | Freq: Every day | ORAL | Status: DC
  Administered 2013-08-20: 81 mg via ORAL
  Filled 2013-08-19: qty 1

## 2013-08-19 MED ORDER — PREDNISONE 5 MG PO TABS
5.0000 mg | ORAL_TABLET | Freq: Every day | ORAL | Status: DC
  Administered 2013-08-20: 5 mg via ORAL
  Filled 2013-08-19 (×2): qty 1

## 2013-08-19 MED ORDER — FUROSEMIDE 10 MG/ML IJ SOLN
20.0000 mg | Freq: Once | INTRAMUSCULAR | Status: AC
  Administered 2013-08-20: 20 mg via INTRAVENOUS
  Filled 2013-08-19: qty 2

## 2013-08-19 MED ORDER — BRIMONIDINE TARTRATE 0.15 % OP SOLN
1.0000 [drp] | Freq: Two times a day (BID) | OPHTHALMIC | Status: DC
  Filled 2013-08-19: qty 5

## 2013-08-19 MED ORDER — BRIMONIDINE TARTRATE 0.2 % OP SOLN
1.0000 [drp] | Freq: Two times a day (BID) | OPHTHALMIC | Status: DC
  Administered 2013-08-19 – 2013-08-20 (×2): 1 [drp] via OPHTHALMIC
  Filled 2013-08-19: qty 5

## 2013-08-19 NOTE — ED Notes (Signed)
Lab paged

## 2013-08-19 NOTE — H&P (Addendum)
[facility] History and Physical  [name] UJW:[id number] DOB: Aug 28, 1926 DOA: 08/19/2013  Referring physician:  PCP: Thora Lance, MD  Specialists:   Chief Complaint: Chest pain and shortness of breath  HPI: [name] is a 79 y.o. male  With a history of CVA, coronary artery disease, diabetes, hypertension, EKG that presents to the emergency department for chest pain and shortness of breath. Patient is accompanied by his daughter. Patient has been seen by nephrology her CK D. as well as anemia. Patient has had extensive anemia workup in the past including EGD and colonoscopy. He continues to have fecal occult positive. Patient comes today for his Procrit injection after which point he became short of breath and had chest pain. His daughter had given him 2 nitroglycerin which seemed to relieve his pain. Patient has seen Dr. Donnie Aho in the past who had an extensive workup in 2007. Patient was found to have a hemoglobin of approximately 9.34 weeks ago.  Per his daughter, he has not had a requiring transfusion in 2 years. Patient currently denies any chest pain. He is a phasic from a stroke. He can comprehend however difficult to verbalize at this time. Unable to obtain more information regarding patient's chest pain at this time.  Review of Systems:  Constitutional: Denies fever, chills, diaphoresis, appetite change and fatigue.  HEENT: Denies photophobia, eye pain, redness, hearing loss, ear pain, congestion, sore throat, rhinorrhea, sneezing, mouth sores, trouble swallowing, neck pain, neck stiffness and tinnitus.   Respiratory: Complains of shortness of breath.  Cardiovascular: Complains of chest pain. Gastrointestinal: Denies nausea, vomiting, abdominal pain, diarrhea, constipation, blood in stool and abdominal distention.  Genitourinary: Denies dysuria, urgency, frequency, hematuria, flank pain and difficulty urinating.  Musculoskeletal: Denies myalgias, back pain, joint  swelling, arthralgias and gait problem.  Skin: Denies pallor, rash and wound.  Neurological: Denies dizziness, seizures, syncope, weakness, light-headedness, numbness and headaches.  Hematological: Denies adenopathy. Easy bruising, personal or family bleeding history  Psychiatric/Behavioral: Denies suicidal ideation, mood changes, confusion, nervousness, sleep disturbance and agitation  Past Medical History  Diagnosis Date  . Hypertension   . Stroke   . Diabetes mellitus without complication   . Coronary artery disease    Past Surgical History  Procedure Laterality Date  . Stints     Social History:  reports that he has never smoked. He does not have any smokeless tobacco history on file. He reports that he does not drink alcohol or use illicit drugs. Lives at home with his daughter.  Allergies  Allergen Reactions  . Shellfish Allergy Anaphylaxis    No family history on file.   Prior to Admission medications   Medication Sig Start Date End Date Taking? Authorizing Provider  aspirin EC 81 MG tablet Take 81 mg by mouth daily.   Yes Historical Provider, MD  brimonidine (ALPHAGAN) 0.15 % ophthalmic solution Place 1 drop into both eyes 2 (two) times daily.   Yes Historical Provider, MD  cholecalciferol (VITAMIN D) 1000 UNITS tablet Take 1,000 Units by mouth daily.   Yes Historical Provider, MD  dipyridamole-aspirin (AGGRENOX) 200-25 MG per 12 hr capsule Take 1 capsule by mouth 2 (two) times daily.   Yes Historical Provider, MD  doxepin (SINEQUAN) 10 MG capsule Take 20 mg by mouth at bedtime.   Yes Historical Provider, MD  Epoetin Alfa (PROCRIT IJ) Inject as directed every 30 (thirty) days.   Yes Historical Provider, MD  finasteride (PROSCAR) 5 MG tablet Take 5 mg by mouth daily.  Yes Historical Provider, MD  furosemide (LASIX) 40 MG tablet Take 40 mg by mouth daily.   Yes Historical Provider, MD  hydrOXYzine (ATARAX/VISTARIL) 25 MG tablet Take 25 mg by mouth every 6 (six) hours as  needed for itching.   Yes Historical Provider, MD  insulin NPH (NOVOLIN N) 100 UNIT/ML injection Inject 0-15 Units into the skin 2 (two) times daily.    Yes Historical Provider, MD  latanoprost (XALATAN) 0.005 % ophthalmic solution Place 1 drop into both eyes at bedtime.   Yes Historical Provider, MD  nitroGLYCERIN (NITROSTAT) 0.4 MG SL tablet Place 0.4 mg under the tongue every 5 (five) minutes as needed for chest pain.   Yes Historical Provider, MD  predniSONE (DELTASONE) 5 MG tablet Take 5 mg by mouth daily with breakfast.   Yes Historical Provider, MD   Physical Exam: Filed Vitals:   08/19/13 1534  BP: 112/54  Pulse:   Temp:   Resp: 18     General: Well developed, well nourished, NAD, appears stated age  HEENT: NCAT, PERRLA, EOMI, Anicteic Sclera, Pale conjunctiva. mucous membranes moist. No pharyngeal erythema or exudates  Neck: Supple, no JVD, no masses,  Cardiovascular: S1 S2 auscultated, no rubs, murmurs or gallops. Regular rate and rhythm.  Respiratory: Clear to auscultation bilaterally with equal chest rise  Abdomen: Soft, nontender, nondistended, + bowel sounds  Extremities: warm dry without cyanosis clubbing or edema  Neuro: AAOx3, cranial nerves grossly intact.   Skin: Without rashes exudates or nodules, pale  Psych: Normal affect and demeanor.  Labs on Admission:  Basic Metabolic Panel:  Recent Labs Lab 08/19/13 1400  NA 140  K 4.0  CL 100  CO2 28  GLUCOSE 211*  BUN 32*  CREATININE 1.84*  CALCIUM 8.9   Liver Function Tests: No results found for this basename: AST, ALT, ALKPHOS, BILITOT, PROT, ALBUMIN,  in the last 168 hours No results found for this basename: LIPASE, AMYLASE,  in the last 168 hours No results found for this basename: AMMONIA,  in the last 168 hours CBC:  Recent Labs Lab 08/19/13 1400  WBC 5.6  HGB 8.5*  HCT 28.6*  MCV 100.0  PLT 156   Cardiac Enzymes: No results found for this basename: CKTOTAL, CKMB, CKMBINDEX,  TROPONINI,  in the last 168 hours  BNP (last 3 results) No results found for this basename: PROBNP,  in the last 8760 hours CBG:  Recent Labs Lab 08/19/13 1511  GLUCAP 191*    Radiological Exams on Admission: Dg Chest 2 View  08/19/2013   CLINICAL DATA:  Persistent cough after 5 days of antibiotics 11/07/2011  EXAM: CHEST  2 VIEW  COMPARISON:  None.  FINDINGS: Normal heart size, mediastinal contours and pulmonary vascularity.  Volume loss in right hemi thorax with mediastinal shift to right unchanged.  Persistent pleural effusion versus chronic thickening at the lateral right lung base with associated atelectasis or consolidation, little changed from previous exam.  Underlying mass not completely excluded though the appearance is unchanged.  Central peribronchial thickening.  Remaining lungs clear.  Calcified granuloma left upper lobe unchanged since 07/09/2009 study.  No acute osseous findings.  Coronary arterial stents noted on lateral view.  IMPRESSION: Chronic pleural effusion or thickening at the lateral right lung base with associated atelectasis versus consolidation at the right lung base.  Mild bronchitic changes.   Electronically Signed   By: Ulyses Southward M.D.   On: 08/19/2013 14:57    EKG: Independently reviewed. Sinus tachycardia, rate 11,  PVC, normal axis  Assessment/Plan Active Problems:   C O P D   PLEURAL EFFUSION, RIGHT   CAD (coronary artery disease)   H/O: stroke   HTN (hypertension)   Diabetes   Chest pain   Anemia  Chest pain rule out acute coronary syndrome Patient has a history of coronary artery disease with a CABG. At this time his chest pain is likely not acute coronary syndrome I may be secondary to his anemia. However patient will be admitted for observation. Will place him on telemetry unit. Will obtain a fasting lipid panel, magnesium, phosphate, TSH levels. Will continue to trend his troponin levels. Will also consult cardiology for further intervention  and management. Will add on metoprolol 12.5 mg twice a day. Currently not an ACE inhibitor likely secondary to CKD.  Symptomatic anemia Patient has a baseline hemoglobin of approximately 10. At this time his hemoglobin has been found to be 8.5. Patient has been receiving Procrit injections per nephrology. This may be an extension of his anemia with chronic kidney disease. Patient supposedly has had EGD as well as colonoscopy in the last 3-4 months as he has had positive fecal occult blood test. May consider hematology consult as workup does not seem to be done from a hematologic aspect. Patient's last anemia panel was done in Nov 2014.  Will repeat.   CKD, Stage III Creatinine is currently stable, 1.84. Continue to monitor.  History of stroke Will continue Aggrenox.  Diabetes mellitus Will continue patient on his home insulin. Will also place on insulin sliding scale with CBG monitoring, will obtain hemoglobin A1c.  Depression/anxiety Continue doxepin  DVT prophylaxis: Lovenox  Code Status: Full  Condition: Guarded  Family Communication: Daughter at bedside. Admission, patients condition and plan of care including tests being ordered have been discussed with the patient and daughter who indicate understanding and agree with the plan and Code Status.  Disposition Plan: Admitted for observation.  Time spent: 45 minutes  Clerence Gubser D.O. [facility] Pager [phone]  If 7PM-7AM, please contact night-coverage [email or web address] Password [facility] 08/19/2013, 5:16 PM

## 2013-08-19 NOTE — ED Notes (Signed)
Admitting physician at bedside

## 2013-08-19 NOTE — Progress Notes (Signed)
Called [name] at dr webbs office and reported hgb of 8 today and that his daughter said he had chest pain this morning and had to take nitro, and that he was dizzy, and had hallucinations.  Dr Hyman Hopes stated for him to go to the ER since he had chest pain this morning.  Daughter is aware and is taking him to the ER now.

## 2013-08-19 NOTE — ED Provider Notes (Signed)
CSN: [id number]     Arrival date & time 08/19/13  1345 History   First MD Initiated Contact with Patient 08/19/13 1456     Chief Complaint  Patient presents with  . Chest Pain   (Consider location/radiation/quality/duration/timing/severity/associated sxs/prior Treatment) HPI Comments: The patient is an 79 year old male past medical history significant for hypertension, D.O. clinical coronary artery disease, history of CVA, history of MI with 3 stents placed presented to the emergency department for intermittent episodes of chest pain over the last several months. The patient has been going to short stay upstairs in [facility] to receive Procrit and iron transfusions for symptomatic anemia of unknown origin. Patient was going today to receive his Procrit shot when he developed chest pain at home, the daughter gave him 2 sublingual nitroglycerin tablets which relieved his pain. She informed the staff this upstairs at which time they recommended he come to the emergency department for ACS rule out prior to any further treatment being done. Patient is being followed by Dr. Nicholaus Bloom his cardiologist, it appears his last extensive cardiac workup was back in 2007. The daughter is very insistent that the nitroglycerin is "harmless to give to her father for his chest pain this is all related to your his anemia." The patient's last hemoglobin was checked 4 weeks ago and was 9.3 he has not required a transfusion in 2 years. The patient has been worked up extensively for his anemia. He has had negative endoscopy, colonoscopy. He has been noted at times to have positive guaiac tests without source of bleeding. The patient denies any chest pain currently. Patient is a level V caveat d/t decreased verbalization and status post CVA.   The history is provided by the patient and a relative.    Past Medical History  Diagnosis Date  . Hypertension   . Stroke   . Diabetes mellitus without complication   .  Coronary artery disease    Past Surgical History  Procedure Laterality Date  . Stints     No family history on file. History  Substance Use Topics  . Smoking status: Never Smoker   . Smokeless tobacco: Not on file  . Alcohol Use: No    Review of Systems  Unable to perform ROS: Patient nonverbal    Allergies  Shellfish allergy  Home Medications   Current Outpatient Rx  Name  Route  Sig  Dispense  Refill  . aspirin EC 81 MG tablet   Oral   Take 81 mg by mouth daily.         . brimonidine (ALPHAGAN) 0.15 % ophthalmic solution   Both Eyes   Place 1 drop into both eyes 2 (two) times daily.         . cholecalciferol (VITAMIN D) 1000 UNITS tablet   Oral   Take 1,000 Units by mouth daily.         Marland Kitchen dipyridamole-aspirin (AGGRENOX) 200-25 MG per 12 hr capsule   Oral   Take 1 capsule by mouth 2 (two) times daily.         Marland Kitchen doxepin (SINEQUAN) 10 MG capsule   Oral   Take 20 mg by mouth at bedtime.         Marland Kitchen Epoetin Alfa (PROCRIT IJ)   Injection   Inject as directed every 30 (thirty) days.         . finasteride (PROSCAR) 5 MG tablet   Oral   Take 5 mg by mouth daily.         Marland Kitchen  furosemide (LASIX) 40 MG tablet   Oral   Take 40 mg by mouth daily.         . hydrOXYzine (ATARAX/VISTARIL) 25 MG tablet   Oral   Take 25 mg by mouth every 6 (six) hours as needed for itching.         . insulin NPH (NOVOLIN N) 100 UNIT/ML injection   Subcutaneous   Inject 0-15 Units into the skin 2 (two) times daily.          Marland Kitchen latanoprost (XALATAN) 0.005 % ophthalmic solution   Both Eyes   Place 1 drop into both eyes at bedtime.         . nitroGLYCERIN (NITROSTAT) 0.4 MG SL tablet   Sublingual   Place 0.4 mg under the tongue every 5 (five) minutes as needed for chest pain.         . predniSONE (DELTASONE) 5 MG tablet   Oral   Take 5 mg by mouth daily with breakfast.          BP 112/54  Pulse 106  Temp(Src) 97.5 F (36.4 C) (Oral)  Resp 18  SpO2  99% Physical Exam  Constitutional: He is oriented to person, place, and time. He appears well-developed and well-nourished. No distress.  HENT:  Head: Normocephalic and atraumatic.  Right Ear: External ear normal.  Left Ear: External ear normal.  Nose: Nose normal.  Mouth/Throat: Uvula is midline, oropharynx is clear and moist and mucous membranes are normal.  Eyes: Conjunctivae and EOM are normal. Pupils are equal, round, and reactive to light.  Conjunctival pallor  Neck: Neck supple.  Pulmonary/Chest: Effort normal and breath sounds normal. He exhibits no tenderness.  Abdominal: Soft. Bowel sounds are normal. There is no tenderness.  Genitourinary: Guaiac positive stool.  Neurological: He is alert and oriented to person, place, and time.  Skin: Skin is warm and dry. He is not diaphoretic. There is pallor.    ED Course  Procedures (including critical care time) Medications - No data to display  Labs Review Labs Reviewed  CBC - Abnormal; Notable for the following:    RBC 2.86 (*)    Hemoglobin 8.5 (*)    HCT 28.6 (*)    MCHC 29.7 (*)    RDW 23.4 (*)    All other components within normal limits  BASIC METABOLIC PANEL - Abnormal; Notable for the following:    Glucose, Bld 211 (*)    BUN 32 (*)    Creatinine, Ser 1.84 (*)    GFR calc non Af Amer 32 (*)    GFR calc Af Amer 37 (*)    All other components within normal limits  GLUCOSE, CAPILLARY - Abnormal; Notable for the following:    Glucose-Capillary 191 (*)    All other components within normal limits  POCT I-STAT TROPONIN I  TYPE AND SCREEN   Imaging Review Dg Chest 2 View  08/19/2013   CLINICAL DATA:  Persistent cough after 5 days of antibiotics 11/07/2011  EXAM: CHEST  2 VIEW  COMPARISON:  None.  FINDINGS: Normal heart size, mediastinal contours and pulmonary vascularity.  Volume loss in right hemi thorax with mediastinal shift to right unchanged.  Persistent pleural effusion versus chronic thickening at the lateral  right lung base with associated atelectasis or consolidation, little changed from previous exam.  Underlying mass not completely excluded though the appearance is unchanged.  Central peribronchial thickening.  Remaining lungs clear.  Calcified granuloma left upper lobe unchanged since 07/09/2009  study.  No acute osseous findings.  Coronary arterial stents noted on lateral view.  IMPRESSION: Chronic pleural effusion or thickening at the lateral right lung base with associated atelectasis versus consolidation at the right lung base.  Mild bronchitic changes.   Electronically Signed   By: Ulyses Southward M.D.   On: 08/19/2013 14:57    EKG Interpretation   None       MDM   1. CAD (coronary artery disease)   2. Anemia   3. Chest pain   4. Chronic airway obstruction, not elsewhere classified   5. Diabetes   6. H/O: stroke   7. HTN (hypertension)   8. Unspecified pleural effusion     Afebrile, NAD, non-toxic appearing, AAOx4. Patient is endowed from short stay at [facility] for ACS rule out. Patient took 2 sublingual nitroglycerin this morning with relief of his chest pain. Patient has been dealing with worsening chest pain shortness of breath over the last few months that seemed to correlate with worsening anemia. First troponin negative, chest x-ray stable, no acute EKG changes noted. Labs reviewed. Chest pain and shortness of breath likely related to anemia, but given extensive cardiac history we'll admit patient for observation for ACS rule out. Discussed patient case with Dr. Loretha Stapler who agrees with my plan.    Jeannetta Ellis, PA-C 08/19/13 1959

## 2013-08-19 NOTE — ED Notes (Signed)
Pt is here with hgb of 8.0 and daughter states he usually gets chest pain when it is low.  Pt just had Procrit shot today.  Pt is pain free presently

## 2013-08-19 NOTE — ED Notes (Signed)
NOTIFIED DR. Loretha Stapler BY PHONE OF PATIENTS LAB RESULTS OF POCT OCCULT BLOOD STOOL = POSITIVE ( + ) ,@15 :57 PM ,08/19/2013.

## 2013-08-19 NOTE — Consult Note (Signed)
Cardiology Consult Note  Admit date: 08/19/2013 Name: [name] 79 y.o.  male DOB:  Mar 06, 1926 MRN:  [id number]  Today's date:  08/19/2013  Referring Physician:   [facility]  Reason for Consultation:    Chest pain  IMPRESSIONS: 1. Chest pain that may be due 2 anemia in a patient with known coronary artery disease and previous stenting consistent with angina 2. Significant anemia of chronic renal disease treated with intravenous iron and Procrit on a regular basis 3. 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 Prior history of pericardial effusion treated with pericardial window in 2007 5. Coronary artery disease with previous stenting of the right coronary artery and cutting balloon angioplasty of the right coronary artery in 2007 6. Chronic skin rash 7. Cryptogenic cirrhosis 8. Stage IV chronic kidney disease  RECOMMENDATION: 1. Continue his current home medications 2. Transfuse 2 units of packed red blood cells this evening 3. Consider early discharge once he has been transfused  hopefully if he is stable in the morning.  HISTORY: This 79 year old male is well-known to me. He has a history of coronary artery disease with 2 stents placed in the right coronary artery in 1996 and 2005 with cutting balloon angioplasty of in-stent restenosis in 2007. He has also had a significant chronic skin rash that has been quite difficult to manage her with the years. He developed pericardial Not had a window in 2007 and then had a left middle cerebral artery stroke in the same month and was treated with intra-arterial TPA as well as clot extraction. He has done fairly well since that time has a marked expressive aphasia.  He then began to have significant anemia and was found to have cryptogenic cirrhosis as well as stage 3-4 chronic kidney disease. His anemia has been managed with intravenous iron and ultimately Procrit which  is been given through the years. He has also had a recurrent pleural effusion and this has been tapped and evaluated through the years and is now chronic.  He has had increasing weakness, dizziness and some confusion and has had increasing frequency of his Procrit injections and does in the past several months. He developed some chest discomfort earlier today and came to short stay for a routine Procrit injection. His hemoglobin was found to be lower than it normally is around 8 and the nephrologists were called and when they got the history of chest pain he was told to come to the emergency room to be evaluated. His EKG was nonacute and his 1 troponin was negative. He tends to get angina when his anemia is more severe. He has some mild dyspnea on exertion but normally gets along reasonably well at home.  Past Medical History  Diagnosis Date  . Hypertension   . Diabetes mellitus without complication   . Coronary artery disease   . History of pericardial effusion     Prior pericardial window for tamponade in 2007   . Anemia due to stage 4(moderate) chronic renal failure treated with erythropoietin   . Chronic kidney disease (CKD), stage IV (severe)   . Cryptogenic cirrhosis of liver   . H/O: stroke     10/09/03/2005 treated with intra arterial TPA and clot removal with Merci device for MCA occlusion   . Chronic pleural effusion   . C O P D 12/22/2008      Past Surgical History  Procedure Laterality Date  . Pericardial window    .  Hemorroidectomy    . Shoulder surgery    . Ankle surgery      Allergies:  is allergic to shellfish allergy; amlodipine; crestor; diltiazem; ranolazine; and clopidogrel.   Medications: Prior to Admission medications   Medication Sig Start Date End Date Taking? Authorizing Provider  aspirin EC 81 MG tablet Take 81 mg by mouth daily.   Yes Historical Provider, MD  brimonidine (ALPHAGAN) 0.15 % ophthalmic solution Place 1 drop into both eyes 2 (two) times daily.    Yes Historical Provider, MD  cholecalciferol (VITAMIN D) 1000 UNITS tablet Take 1,000 Units by mouth daily.   Yes Historical Provider, MD  dipyridamole-aspirin (AGGRENOX) 200-25 MG per 12 hr capsule Take 1 capsule by mouth 2 (two) times daily.   Yes Historical Provider, MD  doxepin (SINEQUAN) 10 MG capsule Take 20 mg by mouth at bedtime.   Yes Historical Provider, MD  Epoetin Alfa (PROCRIT IJ) Inject as directed every 30 (thirty) days.   Yes Historical Provider, MD  finasteride (PROSCAR) 5 MG tablet Take 5 mg by mouth daily.   Yes Historical Provider, MD  furosemide (LASIX) 40 MG tablet Take 40 mg by mouth daily.   Yes Historical Provider, MD  hydrOXYzine (ATARAX/VISTARIL) 25 MG tablet Take 25 mg by mouth every 6 (six) hours as needed for itching.   Yes Historical Provider, MD  insulin NPH (NOVOLIN N) 100 UNIT/ML injection Inject 0-15 Units into the skin 2 (two) times daily.    Yes Historical Provider, MD  latanoprost (XALATAN) 0.005 % ophthalmic solution Place 1 drop into both eyes at bedtime.   Yes Historical Provider, MD  nitroGLYCERIN (NITROSTAT) 0.4 MG SL tablet Place 0.4 mg under the tongue every 5 (five) minutes as needed for chest pain.   Yes Historical Provider, MD  predniSONE (DELTASONE) 5 MG tablet Take 5 mg by mouth daily with breakfast.   Yes Historical Provider, MD    Family History: Family Status  Relation Status Death Age  . Father Deceased 90    old age  . Mother Deceased 57    Social History:   reports that he has never smoked. He has never used smokeless tobacco. He reports that he does not drink alcohol or use illicit drugs.   History   Social History Narrative  . No narrative on file   Review of Systems: He has been lethargic and more sleepy recently. He has significant expressive aphasia and difficulty using his right arm following his stroke. He has been having more chest discomfort recently. He does have a history of cryptogenic cirrhosis with ascites in the  past. He has urinary frequency. Has some mild arthritis. Other than as noted above the remainder of the review of systems is unremarkable.  Physical Exam: BP 112/54  Pulse 106  Temp(Src) 97.5 F (36.4 C) (Oral)  Resp 18  SpO2 99%  General appearance: Pleasant but mildly agitated white male who has a marked expressive aphasia and is quite active and appears younger than stated age. Head: Normocephalic, without obvious abnormality, atraumatic, Balding male hair pattern Eyes: conjunctivae/corneas clear. PERRL, EOM's intact. Fundi benign. Neck: no adenopathy, no carotid bruit, no JVD and supple, symmetrical, trachea midline Lungs: Clear left, reduced breath sounds right base Heart: Somewhat rapid rhythm, normal S1-S2, no S3 Abdomen: soft, non-tender; bowel sounds normal; no masses,  no organomegaly and Mildly distended Rectal: deferred Extremities: 1-2+ peripheral edema Pulses: 2+ and symmetric Skin: Scattered ecchymoses  Neurologic: Significant right arm paresis, marked expressive aphasia, good movement  of the left arm and both legs  Labs: CBC  Recent Labs  08/19/13 1400  WBC 5.6  RBC 2.86*  HGB 8.5*  HCT 28.6*  PLT 156  MCV 100.0  MCH 29.7  MCHC 29.7*  RDW 23.4*   CMP   Recent Labs  08/19/13 1400  NA 140  K 4.0  CL 100  CO2 28  GLUCOSE 211*  BUN 32*  CREATININE 1.84*  CALCIUM 8.9  GFRNONAA 32*  GFRAA 37*    Radiology: Volume loss of the right lung, chronic right pleural effusion versus scarring  EKG: Right axis deviation, right bundle branch block, PVCs  Signed:  W. Ashley Royalty MD [facility]   Cardiology Consultant  08/19/2013, 6:54 PM

## 2013-08-19 NOTE — ED Provider Notes (Signed)
Medical screening examination/treatment/procedure(s) were conducted as a shared visit with non-physician practitioner(s) and myself.  I personally evaluated the patient during the encounter.  EKG Interpretation    Date/Time:  Tuesday August 19 2013 13:50:49 EST Ventricular Rate:  101 PR Interval:  166 QRS Duration: 116 QT Interval:  372 QTC Calculation: 482 R Axis:   147 Text Interpretation:  Sinus tachycardia with occasional Premature ventricular complexes Low voltage QRS Right bundle branch block Left posterior fascicular block  Bifascicular block  Septal infarct , age undetermined Abnormal ECG No significant change was found Reconfirmed by [facility]  MD, TREY ([id number]) on 08/19/2013 6:26:40 PM           79 yo male with hx of anemia and CAD presenting with progressive chest pain and SOB on exertion, which his family is concerned to be indicative of symptomatic anemia.  His Hg has been persistently dropping over several months.  On exam, well appearing, not distressed, heart sounds normal with RRR, Lungs CTAB.  Initial troponin negative.  Plan admission for further observation.    Clinical Impression: 1. CAD (coronary artery disease)   2. Anemia   3. Chest pain   4. Chronic airway obstruction, not elsewhere classified   5. Diabetes   6. H/O: stroke   7. HTN (hypertension)   8. Unspecified pleural effusion       Candyce Churn, MD 08/19/13 [phone]

## 2013-08-20 DIAGNOSIS — D631 Anemia in chronic kidney disease: Secondary | ICD-10-CM

## 2013-08-20 LAB — LIPID PANEL
Cholesterol: 120 mg/dL (ref 0–200)
LDL Cholesterol: 59 mg/dL (ref 0–99)
Total CHOL/HDL Ratio: 2.6 RATIO
Triglycerides: 68 mg/dL (ref <150)
VLDL: 14 mg/dL (ref 0–40)

## 2013-08-20 LAB — GLUCOSE, CAPILLARY

## 2013-08-20 LAB — HEMOGLOBIN A1C: Mean Plasma Glucose: 154 mg/dL — ABNORMAL HIGH (ref <117)

## 2013-08-20 LAB — CBC
HCT: 35.1 % — ABNORMAL LOW (ref 39.0–52.0)
Hemoglobin: 10.8 g/dL — ABNORMAL LOW (ref 13.0–17.0)
MCV: 93.6 fL (ref 78.0–100.0)
Platelets: 143 10*3/uL — ABNORMAL LOW (ref 150–400)
RBC: 3.75 MIL/uL — ABNORMAL LOW (ref 4.22–5.81)
WBC: 6 10*3/uL (ref 4.0–10.5)

## 2013-08-20 LAB — IRON AND TIBC
Iron: 30 ug/dL — ABNORMAL LOW (ref 42–135)
Saturation Ratios: 14 % — ABNORMAL LOW (ref 20–55)
TIBC: 221 ug/dL (ref 215–435)
UIBC: 191 ug/dL (ref 125–400)

## 2013-08-20 LAB — TROPONIN I: Troponin I: <0.3 ng/mL (ref <0.30)

## 2013-08-20 LAB — FERRITIN: Ferritin: 1226 ng/mL — ABNORMAL HIGH (ref 22–322)

## 2013-08-20 LAB — VITAMIN B12: Vitamin B-12: 633 pg/mL (ref 211–911)

## 2013-08-20 LAB — TSH: TSH: 2.217 u[IU]/mL (ref 0.350–4.500)

## 2013-08-20 MED ORDER — METOPROLOL TARTRATE 12.5 MG HALF TABLET: 12.5000 mg | ORAL_TABLET | Freq: Two times a day (BID) | ORAL | Status: DC

## 2013-08-20 MED ORDER — FUROSEMIDE 40 MG PO TABS: 40.0000 mg | ORAL_TABLET | Freq: Two times a day (BID) | ORAL | Status: DC

## 2013-08-20 NOTE — Progress Notes (Signed)
D/c orders received;IV removed with gauze on, pt remains in stable condition, pt meds and instructions reviewed and given to pt and pts daughter; pt d/c to home

## 2013-08-20 NOTE — Progress Notes (Signed)
Subjective:  He is feeling better today and has no recurrent chest pain. His daughter mentioned that he had some scrotal edema and recheck that today he has a care leave it amount of scrotal edema. He does have known cryptogenic cirrhosis in the past. Previous ventricular function has been normal. And he really doesn't have significant dyspnea at this time.  Objective:  Vital Signs in the last 24 hours: BP 103/58  Pulse 85  Temp(Src) 97.7 F (36.5 C) (Oral)  Resp 18  Ht 5\' 7"  (1.702 m)  Wt 77.384 kg (170 lb 9.6 oz)  BMI 26.71 kg/m2  SpO2 98%  Physical Exam: Pleasant white male in no acute distress Lungs:  Clear Cardiac:  Regular rhythm, normal S1 and S2, no S3 Abdomen:  Mildly distended, significant scrotal edema.  Extremities:  1-2+ edema present  Intake/Output from previous day: 12/23 0701 - 12/24 0700 In: 899.1 [P.O.:240; Blood:659.1] Out: 350 [Urine:350]  Weight Filed Weights   08/19/13 2000  Weight: 77.384 kg (170 lb 9.6 oz)    Lab Results: Basic Metabolic Panel:  Recent Labs  82/95/62 1400  NA 140  K 4.0  CL 100  CO2 28  GLUCOSE 211*  BUN 32*  CREATININE 1.84*   CBC:  Recent Labs  08/19/13 1400  WBC 5.6  HGB 8.5*  HCT 28.6*  MCV 100.0  PLT 156   Cardiac Enzymes:  Recent Labs  08/19/13 2100 08/20/13 0655  TROPONINI <0.30 <0.30    Telemetry: Sinus rhythm  Assessment/Plan: 1. Anemia chronic kidney disease status post 2 units of blood 2. Significant scrotal edema that may be due to his cryptogenic cirrhosis 3. Coronary artery disease with previous stenting of the right coronary artery no angina at the present time  Recommendations:  He has received 2 units of packed cells. He is currently feeling better but does have some scrotal edema and had edema yesterday. He does have known cryptogenic cirrhosis and my recommendations would be to increase his diuretics and to restrict his fluids somewhat. I think this can likely be accomplished as an  outpatient he wants to get home for the holidays.     Darden Palmer  MD [facility] Card[facility]  08/20/2013, 9:09 AM

## 2013-08-20 NOTE — Discharge Summary (Addendum)
Physician Discharge Summary  [name]:[id number] DOB: 1926/05/22 DOA: 08/19/2013  PCP: Thora Lance, MD  Admit date: 08/19/2013 Discharge date: 08/20/2013  Time spent: 35 minutes  Recommendations for Outpatient Follow-up:  1. Need CBC to follow Hb.  2. Need to discussed with PCP resumption of baby aspirin.    Discharge Diagnoses:    Chest pain.    Symptomatic Anemia.    Diastolic Heart Failure.    C O P D   Chronic pleural effusion   CAD (coronary artery disease)   H/O: stroke   HTN (hypertension)   Diabetes   Chest pain   Anemia due to stage 4(moderate) chronic renal failure treated with erythropoietin   History of pericardial effusion   Chronic kidney disease (CKD), stage IV (severe)   Cryptogenic cirrhosis of liver   Discharge Condition: Stable.   Diet recommendation: Heart Healthy.   Filed Weights   08/19/13 2000  Weight: 77.384 kg (170 lb 9.6 oz)    History of present illness:  [name] is a 79 y.o. male  With a history of CVA, coronary artery disease, diabetes, hypertension, EKG that presents to the emergency department for chest pain and shortness of breath. Patient is accompanied by his daughter. Patient has been seen by nephrology her CK D. as well as anemia. Patient has had extensive anemia workup in the past including EGD and colonoscopy. He continues to have fecal occult positive. Patient comes today for his Procrit injection after which point he became short of breath and had chest pain. His daughter had given him 2 nitroglycerin which seemed to relieve his pain. Patient has seen Dr. Donnie Aho in the past who had an extensive workup in 2007. Patient was found to have a hemoglobin of approximately 9.34 weeks ago. Per his daughter, he has not had a requiring transfusion in 2 years. Patient currently denies any chest pain. He is a phasic from a stroke. He can comprehend however difficult to verbalize at this time. Unable to obtain more information  regarding patient's chest pain at this time   Hospital Course:  Chest pain rule out acute coronary syndrome  Patient has a history of coronary artery disease with a CABG. At this time his chest pain is likely not acute coronary syndrome I may be secondary to his anemia. Currently not an ACE inhibitor likely secondary to CKD. Continue with metoprolol. Troponin times 3 negative. Chest pain free.   Symptomatic anemia  Patient has a baseline hemoglobin of approximately 10. On admission hb was at  8.5. Patient has been receiving Procrit injections per nephrology. This may be an extension of his anemia with chronic kidney disease. Patient supposedly has had EGD as well as colonoscopy in the last 3-4 months as he has had positive fecal occult blood test. May consider hematology consult . Hb at 10 post 2 units of PRBC. He is on agrenox for stroke prevention. He is also on baby aspirin. I will hold on baby aspirin for now,. Needs to follow up with PCP to consider resume this medication.   Diastolic Heart failure; patient with lower extremities edema. Discussed with Dr Donnie Aho will increase lasix to 40 mg BID at discharge. Patient to follow with Dr Dr Donnie Aho next week.    Scrotal edema. Patient denies pain. Family aware that patient needs follow up with urology.   CKD, Stage III  Creatinine is currently stable, 1.84.  History of stroke  Will continue Aggrenox.   Diabetes mellitus  Will continue  patient on his home insulin.   Depression/anxiety  Continue doxepin   Procedures:  none  Consultations:  Dr Donnie Aho.   Discharge Exam: Filed Vitals:   08/20/13 1028  BP: 110/55  Pulse: 96  Temp:   Resp:     General: No distress.  Cardiovascular: S 1, S 2 RRR Respiratory: CTA  Discharge Instructions  Discharge Orders   Future Orders Complete By Expires   Diet - low sodium heart healthy  As directed    Increase activity slowly  As directed        Medication List    STOP taking  these medications       aspirin EC 81 MG tablet      TAKE these medications       brimonidine 0.15 % ophthalmic solution  Commonly known as:  ALPHAGAN  Place 1 drop into both eyes 2 (two) times daily.     cholecalciferol 1000 UNITS tablet  Commonly known as:  VITAMIN D  Take 1,000 Units by mouth daily.     dipyridamole-aspirin 200-25 MG per 12 hr capsule  Commonly known as:  AGGRENOX  Take 1 capsule by mouth 2 (two) times daily.     doxepin 10 MG capsule  Commonly known as:  SINEQUAN  Take 20 mg by mouth at bedtime.     finasteride 5 MG tablet  Commonly known as:  PROSCAR  Take 5 mg by mouth daily.     furosemide 40 MG tablet  Commonly known as:  LASIX  Take 1 tablet (40 mg total) by mouth 2 (two) times daily.     hydrOXYzine 25 MG tablet  Commonly known as:  ATARAX/VISTARIL  Take 25 mg by mouth every 6 (six) hours as needed for itching.     insulin NPH 100 UNIT/ML injection  Commonly known as:  HUMULIN N,NOVOLIN N  - Inject 6-12 Units into the skin 2 (two) times daily at 8 am and 10 pm. 96-120 = 6 units  - 121-145 = 8 units  - 146-170 = 10 units  - 171-200 = 12 units     latanoprost 0.005 % ophthalmic solution  Commonly known as:  XALATAN  Place 1 drop into both eyes at bedtime.     metoprolol tartrate 12.5 mg Tabs tablet  Commonly known as:  LOPRESSOR  Take 0.5 tablets (12.5 mg total) by mouth 2 (two) times daily.     nitroGLYCERIN 0.4 MG SL tablet  Commonly known as:  NITROSTAT  Place 0.4 mg under the tongue every 5 (five) minutes as needed for chest pain.     predniSONE 5 MG tablet  Commonly known as:  DELTASONE  Take 5 mg by mouth daily with breakfast.     PROCRIT IJ  Inject as directed every 30 (thirty) days.       Allergies  Allergen Reactions  . Shellfish Allergy Anaphylaxis  . Amlodipine   . Crestor [Rosuvastatin]   . Diltiazem Diarrhea  . Ranolazine     dizziness   . Clopidogrel Rash       Follow-up Information   Follow up with  Thora Lance, MD In 3 days.   Specialty:  Family Medicine   Contact information:   [address], [address] [place] [address] [phone]       Follow up with Darden Palmer, MD In 3 days.   Specialty:  Cardiology   Contact information:   [address] [place] [address] [place] [address] [phone]  The results of significant diagnostics from this hospitalization (including imaging, microbiology, ancillary and laboratory) are listed below for reference.    Significant Diagnostic Studies: Dg Chest 2 View  08/19/2013   CLINICAL DATA:  Persistent cough after 5 days of antibiotics 11/07/2011  EXAM: CHEST  2 VIEW  COMPARISON:  None.  FINDINGS: Normal heart size, mediastinal contours and pulmonary vascularity.  Volume loss in right hemi thorax with mediastinal shift to right unchanged.  Persistent pleural effusion versus chronic thickening at the lateral right lung base with associated atelectasis or consolidation, little changed from previous exam.  Underlying mass not completely excluded though the appearance is unchanged.  Central peribronchial thickening.  Remaining lungs clear.  Calcified granuloma left upper lobe unchanged since 07/09/2009 study.  No acute osseous findings.  Coronary arterial stents noted on lateral view.  IMPRESSION: Chronic pleural effusion or thickening at the lateral right lung base with associated atelectasis versus consolidation at the right lung base.  Mild bronchitic changes.   Electronically Signed   By: Ulyses Southward M.D.   On: 08/19/2013 14:57    Microbiology: No results found for this or any previous visit (from the past 240 hour(s)).   Labs: Basic Metabolic Panel:  Recent Labs Lab 08/19/13 1400 08/19/13 2100  NA 140  --   K 4.0  --   CL 100  --   CO2 28  --   GLUCOSE 211*  --   BUN 32*  --   CREATININE 1.84*  --   CALCIUM 8.9  --   MG  --  2.1  PHOS  --  3.6   Liver Function Tests: No results found for this  basename: AST, ALT, ALKPHOS, BILITOT, PROT, ALBUMIN,  in the last 168 hours No results found for this basename: LIPASE, AMYLASE,  in the last 168 hours No results found for this basename: AMMONIA,  in the last 168 hours CBC:  Recent Labs Lab 08/19/13 1400  WBC 5.6  HGB 8.5*  HCT 28.6*  MCV 100.0  PLT 156   Cardiac Enzymes:  Recent Labs Lab 08/19/13 2100 08/20/13 0655  TROPONINI <0.30 <0.30   BNP: BNP (last 3 results) No results found for this basename: PROBNP,  in the last 8760 hours CBG:  Recent Labs Lab 08/19/13 1511 08/19/13 2136 08/20/13 0732  GLUCAP 191* 202* 104*       Signed:  Hermes Wafer  [facility] 08/20/2013, 11:28 AM

## 2013-08-20 NOTE — Progress Notes (Signed)
UR completed 

## 2013-08-21 LAB — TYPE AND SCREEN
ABO/RH(D): O NEG
Unit division: 0

## 2013-08-22 LAB — POCT HEMOGLOBIN-HEMACUE: Hemoglobin: 8 g/dL — ABNORMAL LOW (ref 13.0–17.0)

## 2013-08-22 NOTE — ED Provider Notes (Signed)
Medical screening examination/treatment/procedure(s) were conducted as a shared visit with non-physician practitioner(s) and myself.  I personally evaluated the patient during the encounter.   Please see my separate note.     Candyce Churn, MD 08/22/13 [phone]

## 2013-09-02 ENCOUNTER — Encounter: Payer: Self-pay | Admitting: Cardiology

## 2013-09-02 NOTE — Progress Notes (Unsigned)
Patient ID: [name], male   DOB: [birth year], 80 y.o.   MRN: [id number]   [name], [name]  Date of visit:  09/02/2013 DOB:  November 20, 1925    Age:  80 yrs. Medical record number:  [id number]     Account number:  [id number] Primary Care Provider: Gaynelle Arabian R ____________________________ CURRENT DIAGNOSES  1. CAD,Native  2. Hypertension-Essential (Benign)  3. Cva [cerebrovascular Accident] Nos  4. Hyperlipidemia  5. Anemia,other specified  6. Chronic Kidney Disease (Stage 3)  7. Dyspnea  8. Stent Placement  9. Pleural effusion  10. Diabetes Mellitus-IDD ____________________________ ALLERGIES  Amlodipine, Rash  Diltiazem, Edema  Metoprolol, Itching of skin  Plavix, Rash  Ranolazine, Intolerance-dizziness  Rosuvastatin, Intolerance-diarrhea  Shellfish Derived, Intolerance-unknown ____________________________ MEDICATIONS  1. brimonidine 0.2 % Drops, 1 gtt od  bid  2. Finasteride 5 mg Tablet, 1 p.o. daily  3. Novolin N 100 unit/mL Suspension, sliding scale  4. Aggrenox 200-25 mg Cap, Multiphasic Release 12 hr, BID  5. doxepin 10 mg Capsule, 2 hs  6. hydroxyzine HCl 25 mg Tablet, q6h prn itching  7. prednisone 5 mg Tablet, 1 p.o. daily  8. furosemide 40 mg Tablet, 1 p.o. daily  9. Procrit 20,000 unit/mL Solution, q month  10. latanoprost 0.005 % Drops, 1gtt od qhs  11. Vitamin D3 1,000 unit tablet, 1 p.o. daily  12. omeprazole 20 mg capsule,delayed release(DR/EC), 1 p.o. daily  13. aspirin 81 mg tablet,chewable, 1 p.o. daily  14. nitroglycerin 0.4 mg tablet, sublingual, PRN ____________________________ CHIEF COMPLAINTS  Followup of Anemia, unspecified  Followup of CAD,Native ____________________________ HISTORY OF PRESENT ILLNESS Patient seen for cardiac followup. The patient was seen on an urgent basis in the emergency room with complaints of chest pain. The real story was that his nephrologist had changed and his new nephrologist had not given him intravenous iron on as  frequent a basis and his hemoglobin began to drop. He then developed a hemoglobin of 8 and began having chest pain with this and was advised to go to the emergency room. I was asked to see him in the emergency room and recommended that he be transfused. After receiving 2 units of blood his complaints of malaise disorientation and chest pain have now resolved. Both his daughters are back today and are requesting some help with management of his anemia. The patient has not had any recurrence of chest pain. He denies recurrent chest pain now but does have some mild dyspnea. He has a chronic pleural effusion. He continues to have significant expressive aphasia. 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 Cyndia Bent, pleural effusion, ascites;  Cardiology Procedures-Invasive:  cardiac cath (left), Cypher Stent RCA December 2005, stent RCA 1996, cutting balloon for ISR of RCA 1/07;  Cardiology Procedures-Noninvasive:  echocardiogram February 2011;  Cardiac Cath Results:  normal Left main, 50% stenosis mid LAD, 60% stenosis proximal Ramus, 30% stenosis mid OM 1, occluded OM 3, 2 Cypher stents placed to mid RCA;  LVEF of 60% documented via echocardiogram on 09/29/2009,   ____________________________ Caleen Essex TEST DATES EKG Date:  05/21/2012;   Cardiac Cath Date:  09/14/2005;  Stent Placement Date: 09/18/2005;  Echocardiography Date: 09/29/2009;  Chest Xray Date: 11/07/2011;   ____________________________  FAMILY HISTORY Brother -- Dementia/Alzheimer's Father -- Father dead, Death due to  natural cause, Diabetes mellitus Mother -- Mother dead, Cancer ____________________________ SOCIAL HISTORY Alcohol Use:  no alcohol use;  Smoking:  used to smoke but quit 1996;  Diet:  regular diet;  Lifestyle:  widower;  Exercise:  exercise is limited due to physical disability;  Occupation:  retired and Oncologist;  Residence:  lives with daughter;   ____________________________ REVIEW OF SYSTEMS General:  malaise and fatigue Eyes: glaucoma, wears eye glasses/contact lenses, cataracts Ears, Nose, Throat, Mouth:  mild partial hearing loss Respiratory: denies dyspnea, cough, wheezing or hemoptysis. Cardiovascular:  please review HPI  Genitourinary-Male: hesitancy, urinary frequency  Musculoskeletal:  arthritis of hip, arthritis of the wrist  ____________________________ PHYSICAL EXAMINATION VITAL SIGNS  Blood Pressure:  110/60 Sitting, Left arm, regular cuff  , 110/66 Standing, Left arm and regular cuff   Pulse:  76/min. Weight:  159.00 lbs. Height:  66"BMI: 25  Constitutional:  pleasant white male in no acute distress walks with cane Skin:  scattered ecchymosis Head:  normocephalic, normal hair pattern, no masses or tenderness Neck:  supple, no masses, thyromegaly, JVD. Carotid pulses are full and equal bilaterally without bruits. Chest:  normal symmetry, clear to auscultation and percussion. Cardiac:  regular rhythm, normal S1 and S2, No S3 or S4, no murmurs, gallops or rubs detected. Abdomen:  scar lower subxyphoid region Peripheral Pulses:  the femoral,dorsalis pedis, and posterior tibial pulses are full and equal bilaterally with no bruits auscultated. Extremities & Back:  mild bilateral venous insufficiency changes present, no edema present Neurological:  paralysis of the right arm, expressive aphasia present ____________________________ MOST RECENT LIPID PANEL 09/02/13  CHOL TOTL 122 mg/dl, LDL 64 NM, HDL 44 mg/dl, TRIGLYCER 70 mg/dl and CHOL/HDL 2.8  (Calc) ____________________________ IMPRESSIONS/PLAN 1. Recent chest pain likely due to low hemoglobin that has now resolved 2. Anemia both due to iron deficiency and renal insufficiency 3. Prior stroke 4. Coronary artery disease 5. Pleural effusion 6. Cryptogenic cirrhosis  Recommendations:  Clinically the patient is really feeling a lot better. The family tells me that the nephrologist may not want to manage the anemia and wants to get a hematology consultation. I am asked them to see Dr. Beryle Beams to see if he would be able to assist in management of his chronic anemia and EPO injections. I will see him for his regular appointment. ____________________________ TODAYS ORDERS  1. Return Visit: keep appt  2. Heme/Onc Consult: Schedule ASAP  3. Lipid Panel: Today                       ____________________________ Cardiology Physician:  Kerry Hough MD [facility]

## 2013-09-03 ENCOUNTER — Telehealth: Payer: Self-pay | Admitting: Hematology and Oncology

## 2013-09-03 NOTE — Telephone Encounter (Signed)
C/D 09/03/12 for appt. 09/08/13

## 2013-09-03 NOTE — Telephone Encounter (Signed)
S/W PT DTR Levada Dy AND GVE NP APPT 01/12 @ 9:30 W/DR. Hoffman DX- ANEMIA, EPO AND FE DEPENDENT HIGH FERRITIN WELCOME PACKET MAILED.

## 2013-09-08 ENCOUNTER — Ambulatory Visit (HOSPITAL_BASED_OUTPATIENT_CLINIC_OR_DEPARTMENT_OTHER): Payer: Medicare Other | Admitting: Hematology and Oncology

## 2013-09-08 ENCOUNTER — Encounter: Payer: Self-pay | Admitting: Hematology and Oncology

## 2013-09-08 ENCOUNTER — Telehealth: Payer: Self-pay | Admitting: Hematology and Oncology

## 2013-09-08 ENCOUNTER — Ambulatory Visit (HOSPITAL_BASED_OUTPATIENT_CLINIC_OR_DEPARTMENT_OTHER): Payer: Medicare Other

## 2013-09-08 VITALS — BP 124/54 | HR 78 | Temp 98.1°F | Ht 67.0 in | Wt 158.5 lb

## 2013-09-08 DIAGNOSIS — D649 Anemia, unspecified: Secondary | ICD-10-CM

## 2013-09-08 DIAGNOSIS — D696 Thrombocytopenia, unspecified: Secondary | ICD-10-CM

## 2013-09-08 DIAGNOSIS — N183 Chronic kidney disease, stage 3 unspecified: Secondary | ICD-10-CM

## 2013-09-08 DIAGNOSIS — D631 Anemia in chronic kidney disease: Secondary | ICD-10-CM

## 2013-09-08 DIAGNOSIS — K7460 Unspecified cirrhosis of liver: Secondary | ICD-10-CM

## 2013-09-08 LAB — COMPREHENSIVE METABOLIC PANEL (CC13)
ALBUMIN: 3.8 g/dL (ref 3.5–5.0)
ALT: 13 U/L (ref 0–55)
ANION GAP: 12 meq/L — AB (ref 3–11)
AST: 23 U/L (ref 5–34)
Alkaline Phosphatase: 115 U/L (ref 40–150)
BILIRUBIN TOTAL: 1.17 mg/dL (ref 0.20–1.20)
BUN: 35.2 mg/dL — ABNORMAL HIGH (ref 7.0–26.0)
CO2: 27 meq/L (ref 22–29)
Calcium: 9.4 mg/dL (ref 8.4–10.4)
Chloride: 102 mEq/L (ref 98–109)
Creatinine: 2.1 mg/dL — ABNORMAL HIGH (ref 0.7–1.3)
GLUCOSE: 143 mg/dL — AB (ref 70–140)
Potassium: 4.1 mEq/L (ref 3.5–5.1)
Sodium: 141 mEq/L (ref 136–145)
TOTAL PROTEIN: 6.9 g/dL (ref 6.4–8.3)

## 2013-09-08 LAB — MANUAL DIFFERENTIAL
ALC: 0.4 10*3/uL — ABNORMAL LOW (ref 0.9–3.3)
ANC (CHCC MAN DIFF): 2.3 10*3/uL (ref 1.5–6.5)
BAND NEUTROPHILS: 2 % (ref 0–10)
BASOPHIL: 4 % — AB (ref 0–2)
BLASTS: 6 % — AB (ref 0–0)
EOS: 0 % (ref 0–7)
LYMPH: 12 % — ABNORMAL LOW (ref 14–49)
METAMYELOCYTES PCT: 2 % — AB (ref 0–0)
MONO: 4 % (ref 0–14)
MYELOCYTES: 6 % — AB (ref 0–0)
Other Cell: 0 % (ref 0–0)
PLT EST: DECREASED
PROMYELO: 1 % — AB (ref 0–0)
SEG: 63 % (ref 38–77)
Variant Lymph: 0 % (ref 0–0)
nRBC: 1 % — ABNORMAL HIGH (ref 0–0)

## 2013-09-08 LAB — CBC WITH DIFFERENTIAL/PLATELET
HEMATOCRIT: 34.3 % — AB (ref 38.4–49.9)
HEMOGLOBIN: 10.4 g/dL — AB (ref 13.0–17.1)
MCH: 28.5 pg (ref 27.2–33.4)
MCHC: 30.3 g/dL — ABNORMAL LOW (ref 32.0–36.0)
MCV: 94 fL (ref 79.3–98.0)
Platelets: 140 10*3/uL (ref 140–400)
RBC: 3.65 10*6/uL — ABNORMAL LOW (ref 4.20–5.82)
RDW: 22 % — ABNORMAL HIGH (ref 11.0–14.6)
WBC: 3.2 10*3/uL — ABNORMAL LOW (ref 4.0–10.3)

## 2013-09-08 LAB — HOLD TUBE, BLOOD BANK

## 2013-09-08 NOTE — Progress Notes (Signed)
[facility] CONSULT NOTE  Patient Care Team: Gaynelle Arabian, MD as PCP - General (Family Medicine) Jacolyn Reedy, MD as Consulting Physician (Cardiology) Rexene Agent, MD as Referring Physician (Nephrology) Heath Lark, MD as Consulting Physician (Hematology and Oncology)  CHIEF COMPLAINTS/PURPOSE OF CONSULTATION:  Anemia chronic disease  HISTORY OF PRESENTING ILLNESS:  [name] 80 y.o. male is here because of chronic anemia. The patient himself has expressive dysphasia. History is impossible. I reviewed his chart and collaborated history with his medical power of attorney, his daughter  He was found to have abnormal CBC from routine blood work. For the last 3 years, he was managed by the nephrologist we periodic erythropoietin stimulating agents injections as well as monthly iron infusion. Most recently, he developed infection in his fingers and progressive anemia requiring blood transfusion. His last iron infusion was on 08/07/2013. He received 40,000 the Procrit on 08/19/2013 and 2 units of blood for hemoglobin of 8 g. He was noted to be symptomatic with a hemoglobin of 8 with some weakness and confusion as well as hallucination. After the blood transfusion, his symptoms has resolved There were no reported recent chest pain on exertion, shortness of breath on minimal exertion, pre-syncopal episodes, or palpitations. Family had not noticed any recent bleeding such as epistaxis, hematuria or hematochezia The patient does not take over the counter NSAID ingestion. He is on antiplatelets agents for history of stroke. His last EGD, capsule endoscopy and colonoscopy were in 2009 He denies any pica and eats a variety of diet. He never donated blood  MEDICAL HISTORY:  Past Medical History  Diagnosis Date  . Coronary artery disease   . History of pericardial effusion     Prior pericardial window for tamponade in 2007   . Chronic kidney disease (CKD), stage IV (severe)    . Cryptogenic cirrhosis of liver   . H/O: stroke     10/09/03/2005 treated with intra arterial TPA and clot removal with Merci device for MCA occlusion   . Chronic pleural effusion   . C O P D 12/22/2008  . Myocardial infarction 12/1994; 07/2004  . History of blood transfusion     "he's had several; going to get 2 units today" (08/19/2013)  . Family history of anesthesia complication     "2 daughters had trouble waking up & a little nausea w/it" (08/19/2013)  . Hypertension     "not on RX now" (08/19/2013)  . Collapsed lung     "RLL due to fluid from the abdomen" (08/19/2013)  . Exertional shortness of breath   . Stroke 05/2006    "had 30% of his communication skills damaged; left him w/aphasia; dysarthria" (08/19/2013)  . Type II diabetes mellitus 1994  . Arthritis     "right hand & wrist; feet; ankles" (08/19/2013)  . Gout     "recently dx'd" (08/19/2013)  . Depression   . Anemia due to stage 4(moderate) chronic renal failure treated with erythropoietin     "gets a procrit shot q 4 wks" (08/19/2013)    SURGICAL HISTORY: Past Surgical History  Procedure Laterality Date  . Pericardial window  05/2006  . Hemorroidectomy  1970's  . Shoulder surgery Right 1970's    "cut out calcium deposits" (08/19/2013)  . Ankle arthroplasty Right 1990's?    "locked it into place; it was just bone on bone" (08/19/2013)  . Cataract extraction w/ intraocular lens  implant, bilateral Bilateral 2009  . Coronary angioplasty with stent placement  12/1994; 07/2004    "1+ 2" (08/19/2013)  . Cerebral angiogram  06/13/2006    with Merci clot retrieval and reperfusion performed by Dr. Robb Matar 06/21/2006 (08/19/2013)    SOCIAL HISTORY: History   Social History  . Marital Status: Widowed    Spouse Name: N/A    Number of Children: N/A  . Years of Education: N/A   Occupational History  . Not on file.   Social History Main Topics  . Smoking status: Former Smoker -- 0.75 packs/day for 50  years    Types: Cigarettes  . Smokeless tobacco: Never Used     Comment: 08/19/2013 "stopped smoking after 1st MI in 1996"  . Alcohol Use: No  . Drug Use: No  . Sexual Activity: No   Other Topics Concern  . Not on file   Social History Narrative  . No narrative on file    FAMILY HISTORY: History reviewed. No pertinent family history.  ALLERGIES:  is allergic to shellfish allergy; amlodipine; crestor; diltiazem; ranolazine; and clopidogrel.  MEDICATIONS:  Current Outpatient Prescriptions  Medication Sig Dispense Refill  . allopurinol (ZYLOPRIM) 100 MG tablet Take 100 mg by mouth at bedtime.      . brimonidine (ALPHAGAN) 0.15 % ophthalmic solution Place 1 drop into both eyes 2 (two) times daily.      . cholecalciferol (VITAMIN D) 1000 UNITS tablet Take 1,000 Units by mouth daily.      Marland Kitchen dipyridamole-aspirin (AGGRENOX) 200-25 MG per 12 hr capsule Take 1 capsule by mouth 2 (two) times daily.      Marland Kitchen doxepin (SINEQUAN) 10 MG capsule Take 10 mg by mouth at bedtime.       Marland Kitchen Epoetin Alfa (PROCRIT IJ) Inject as directed every 30 (thirty) days.      . finasteride (PROSCAR) 5 MG tablet Take 5 mg by mouth daily.      . furosemide (LASIX) 40 MG tablet Take 1 tablet (40 mg total) by mouth 2 (two) times daily.  30 tablet  0  . hydrOXYzine (ATARAX/VISTARIL) 25 MG tablet Take 25 mg by mouth every 6 (six) hours as needed for itching.      . insulin NPH (HUMULIN N,NOVOLIN N) 100 UNIT/ML injection Inject 6-12 Units into the skin 2 (two) times daily at 8 am and 10 pm. 96-120 = 6 units 121-145 = 8 units 146-170 = 10 units 171-200 = 12 units      . latanoprost (XALATAN) 0.005 % ophthalmic solution Place 1 drop into both eyes at bedtime.      . metoprolol tartrate (LOPRESSOR) 12.5 mg TABS tablet Take 0.5 tablets (12.5 mg total) by mouth 2 (two) times daily.  60 tablet  0  . nitroGLYCERIN (NITROSTAT) 0.4 MG SL tablet Place 0.4 mg under the tongue every 5 (five) minutes as needed for chest pain.      .  predniSONE (DELTASONE) 5 MG tablet Take 5 mg by mouth daily with breakfast.       No current facility-administered medications for this visit.    REVIEW OF SYSTEMS:   Constitutional: Denies fevers, chills or abnormal night sweats Eyes: Denies blurriness of vision, double vision or watery eyes Ears, nose, mouth, throat, and face: Denies mucositis or sore throat Respiratory: Denies cough, dyspnea or wheezes Cardiovascular: Denies palpitation, chest discomfort or lower extremity swelling Gastrointestinal:  Denies nausea, heartburn or change in bowel habits Skin: Denies abnormal skin rashes. The patient has chronic bruising from being on anticoagulation therapy and chronic prednisone treatment for skin  itching Lymphatics: Denies new lymphadenopathy Neurological:Denies numbness, tingling or new weaknesses Behavioral/Psych: Mood is stable, no new changes  All other systems were reviewed with the patient and are negative.  PHYSICAL EXAMINATION: ECOG PERFORMANCE STATUS: 1 - Symptomatic but completely ambulatory  Filed Vitals:   09/08/13 0947  BP: 124/54  Pulse: 78  Temp: 98.1 F (36.7 C)   Filed Weights   09/08/13 0947  Weight: 158 lb 8 oz (71.895 kg)    GENERAL:alert, no distress and comfortable. He looks thin but not cachectic SKIN: The patient has dark skin color, texture, turgor are normal, significant thin skin and chronic bruising were noted EYES: normal, conjunctiva are pink and non-injected, sclera clear OROPHARYNX:no exudate, no erythema and lips, buccal mucosa, and tongue normal  NECK: supple, thyroid normal size, non-tender, without nodularity LYMPH:  no palpable lymphadenopathy in the cervical, axillary or inguinal LUNGS: clear to auscultation and percussion with normal breathing effort HEART: regular rate & rhythm and no murmurs and no lower extremity edema ABDOMEN:abdomen soft, non-tender and normal bowel sounds Musculoskeletal:no cyanosis of digits and no clubbing   PSYCH: alert & oriented x 3 with fluent speech but the patient has obvious dysphasia NEURO: no focal motor/sensory deficits  LABORATORY DATA:  I have reviewed the data as listed I reviewed the patient's peripheral smear. They are presence of rouleaux. The white blood cell morphology is not normal with occasional pseudo-Peger Huet anomalies and a few blasts were seen on the peripheral blood. ASSESSMENT:  Chronic anemia  PLAN #1 Anemia Due to high iron level, he does not require intravenous iron supplements for while. After the patient has left, I reviewed his peripheral smear and saw some circulating blast. I suspect the patient has an evolving myelodysplastic syndrome or full blown acute leukemia that is causing ineffective erythropoiesis I shared the information with her daughter. She informed me that the patient would not want to go through bone marrow biopsy or progressive treatment. She would appreciate aggressive supportive care in the form of erythropoietin stimulating agents, transfusion or intravenous iron Infusion as needed. My plan would be to bring the patient in on a weekly basis to get blood draw and Procrit injection as needed to keep his hemoglobin above 10 g  If his hemoglobin dropped to 8 g or lower, we will give him blood transfusion  I will order iron studies periodically to keep his ferritin above 100  The patient's daughter who is the medical power of attorney sign the consent for erythropoietin stimulating agents #2 chronic kidney disease He'll continue followup with his nephrologist #3 chronic liver cirrhosis with mild thrombocytopenia We will observed carefully.

## 2013-09-08 NOTE — Progress Notes (Signed)
Checked in new patient with no financial issues. He has appt card. Daughter Ellen(poa)signed all for him.

## 2013-09-08 NOTE — Telephone Encounter (Signed)
[facility] ADN PRINTED APPT SCHED AND AVS FOR PTFOR jAN AND feB

## 2013-09-09 ENCOUNTER — Telehealth: Payer: Self-pay | Admitting: *Deleted

## 2013-09-09 LAB — ERYTHROPOIETIN: Erythropoietin: 25.7 m[IU]/mL — ABNORMAL HIGH (ref 2.6–18.5)

## 2013-09-09 NOTE — Telephone Encounter (Signed)
OK 

## 2013-09-09 NOTE — Telephone Encounter (Signed)
Daughter left VM states pt's family has decided to not tell pt he has Leukemia and not pursue BMBx or treatment for Leukemia.  They feel due to his age and condition it is kinder to not pursue treatment and not to inform pt of this diagnosis.   They do not want to upset patient.  She asks that Dr. Alvy Bimler not tell pt about the Leukemia.

## 2013-09-15 ENCOUNTER — Ambulatory Visit (HOSPITAL_BASED_OUTPATIENT_CLINIC_OR_DEPARTMENT_OTHER): Payer: Medicare Other

## 2013-09-15 ENCOUNTER — Other Ambulatory Visit (HOSPITAL_BASED_OUTPATIENT_CLINIC_OR_DEPARTMENT_OTHER): Payer: Medicare Other

## 2013-09-15 VITALS — BP 105/50 | HR 71 | Temp 98.1°F

## 2013-09-15 DIAGNOSIS — N183 Chronic kidney disease, stage 3 unspecified: Secondary | ICD-10-CM

## 2013-09-15 DIAGNOSIS — D649 Anemia, unspecified: Secondary | ICD-10-CM

## 2013-09-15 DIAGNOSIS — D631 Anemia in chronic kidney disease: Secondary | ICD-10-CM

## 2013-09-15 LAB — COMPREHENSIVE METABOLIC PANEL (CC13)
ALK PHOS: 100 U/L (ref 40–150)
ALT: 7 U/L (ref 0–55)
AST: 14 U/L (ref 5–34)
Albumin: 3.5 g/dL (ref 3.5–5.0)
Anion Gap: 11 mEq/L (ref 3–11)
BILIRUBIN TOTAL: 1.09 mg/dL (ref 0.20–1.20)
BUN: 30.2 mg/dL — ABNORMAL HIGH (ref 7.0–26.0)
CO2: 27 mEq/L (ref 22–29)
Calcium: 9 mg/dL (ref 8.4–10.4)
Chloride: 104 mEq/L (ref 98–109)
Creatinine: 2.1 mg/dL — ABNORMAL HIGH (ref 0.7–1.3)
GLUCOSE: 235 mg/dL — AB (ref 70–140)
Potassium: 3.9 mEq/L (ref 3.5–5.1)
SODIUM: 142 meq/L (ref 136–145)
Total Protein: 6.3 g/dL — ABNORMAL LOW (ref 6.4–8.3)

## 2013-09-15 LAB — CBC WITH DIFFERENTIAL/PLATELET
HCT: 31.3 % — ABNORMAL LOW (ref 38.4–49.9)
HGB: 9.4 g/dL — ABNORMAL LOW (ref 13.0–17.1)
MCH: 28.1 pg (ref 27.2–33.4)
MCHC: 30 g/dL — AB (ref 32.0–36.0)
MCV: 93.7 fL (ref 79.3–98.0)
Platelets: 98 10*3/uL — ABNORMAL LOW (ref 140–400)
RBC: 3.34 10*6/uL — ABNORMAL LOW (ref 4.20–5.82)
RDW: 22.6 % — AB (ref 11.0–14.6)
WBC: 3.9 10*3/uL — ABNORMAL LOW (ref 4.0–10.3)

## 2013-09-15 LAB — MANUAL DIFFERENTIAL
ALC: 0.7 10*3/uL — ABNORMAL LOW (ref 0.9–3.3)
ANC (CHCC MAN DIFF): 2.3 10*3/uL (ref 1.5–6.5)
BAND NEUTROPHILS: 11 % — AB (ref 0–10)
BLASTS: 10 % — AB (ref 0–0)
Basophil: 3 % — ABNORMAL HIGH (ref 0–2)
EOS: 3 % (ref 0–7)
LYMPH: 19 % (ref 14–49)
MONO: 5 % (ref 0–14)
Metamyelocytes: 1 % — ABNORMAL HIGH (ref 0–0)
Myelocytes: 7 % — ABNORMAL HIGH (ref 0–0)
NRBC: 1 % — AB (ref 0–0)
OTHER CELL: 0 % (ref 0–0)
PLT EST: DECREASED
PROMYELO: 0 % (ref 0–0)
SEG: 41 % (ref 38–77)
Variant Lymph: 0 % (ref 0–0)

## 2013-09-15 LAB — HOLD TUBE, BLOOD BANK

## 2013-09-15 MED ORDER — EPOETIN ALFA 20000 UNIT/ML IJ SOLN
20000.0000 [IU] | Freq: Once | INTRAMUSCULAR | Status: AC
  Administered 2013-09-15: 20000 [IU] via SUBCUTANEOUS
  Filled 2013-09-15: qty 1

## 2013-09-15 NOTE — Patient Instructions (Signed)
Epoetin Alfa injection What is this medicine? EPOETIN ALFA (e POE e tin AL fa) helps your body make more red blood cells. This medicine is used to treat anemia caused by chronic kidney failure, cancer chemotherapy, or HIV-therapy. It may also be used before surgery if you have anemia. This medicine may be used for other purposes; ask your health care provider or pharmacist if you have questions. COMMON BRAND NAME(S): Epogen, Procrit What should I tell my health care provider before I take this medicine? They need to know if you have any of these conditions: -blood clotting disorders -cancer patient not on chemotherapy -cystic fibrosis -heart disease, such as angina or heart failure -hemoglobin level of 12 g/dL or greater -high blood pressure -low levels of folate, iron, or vitamin B12 -seizures -an unusual or allergic reaction to erythropoietin, albumin, benzyl alcohol, hamster proteins, other medicines, foods, dyes, or preservatives -pregnant or trying to get pregnant -breast-feeding How should I use this medicine? This medicine is for injection into a vein or under the skin. It is usually given by a health care professional in a hospital or clinic setting. If you get this medicine at home, you will be taught how to prepare and give this medicine. Use exactly as directed. Take your medicine at regular intervals. Do not take your medicine more often than directed. It is important that you put your used needles and syringes in a special sharps container. Do not put them in a trash can. If you do not have a sharps container, call your pharmacist or healthcare provider to get one. Talk to your pediatrician regarding the use of this medicine in children. While this drug may be prescribed for selected conditions, precautions do apply. Overdosage: If you think you have taken too much of this medicine contact a poison control center or emergency room at once. NOTE: This medicine is only for you. Do  not share this medicine with others. What if I miss a dose? If you miss a dose, take it as soon as you can. If it is almost time for your next dose, take only that dose. Do not take double or extra doses. What may interact with this medicine? Do not take this medicine with any of the following medications: -darbepoetin alfa This list may not describe all possible interactions. Give your health care provider a list of all the medicines, herbs, non-prescription drugs, or dietary supplements you use. Also tell them if you smoke, drink alcohol, or use illegal drugs. Some items may interact with your medicine. What should I watch for while using this medicine? Visit your prescriber or health care professional for regular checks on your progress and for the needed blood tests and blood pressure measurements. It is especially important for the doctor to make sure your hemoglobin level is in the desired range, to limit the risk of potential side effects and to give you the best benefit. Keep all appointments for any recommended tests. Check your blood pressure as directed. Ask your doctor what your blood pressure should be and when you should contact him or her. As your body makes more red blood cells, you may need to take iron, folic acid, or vitamin B supplements. Ask your doctor or health care provider which products are right for you. If you have kidney disease continue dietary restrictions, even though this medication can make you feel better. Talk with your doctor or health care professional about the foods you eat and the vitamins that you take. What   side effects may I notice from receiving this medicine? Side effects that you should report to your doctor or health care professional as soon as possible: -allergic reactions like skin rash, itching or hives, swelling of the face, lips, or tongue -breathing problems -changes in vision -chest pain -confusion, trouble speaking or understanding -feeling  faint or lightheaded, falls -high blood pressure -muscle aches or pains -pain, swelling, warmth in the leg -rapid weight gain -severe headaches -sudden numbness or weakness of the face, arm or leg -trouble walking, dizziness, loss of balance or coordination -seizures (convulsions) -swelling of the ankles, feet, hands -unusually weak or tired Side effects that usually do not require medical attention (report to your doctor or health care professional if they continue or are bothersome): -diarrhea -fever, chills (flu-like symptoms) -headaches -nausea, vomiting -redness, stinging, or swelling at site where injected This list may not describe all possible side effects. Call your doctor for medical advice about side effects. You may report side effects to FDA at 1-800-FDA-1088. Where should I keep my medicine? Keep out of the reach of children. Store in a refrigerator between 2 and 8 degrees C (36 and 46 degrees F). Do not freeze or shake. Throw away any unused portion if using a single-dose vial. Multi-dose vials can be kept in the refrigerator for up to 21 days after the initial dose. Throw away unused medicine. NOTE: This sheet is a summary. It may not cover all possible information. If you have questions about this medicine, talk to your doctor, pharmacist, or health care provider.  2014, Elsevier/Gold Standard. (2008-07-28 10:25:44)  

## 2013-09-16 ENCOUNTER — Encounter (HOSPITAL_COMMUNITY): Payer: Medicare Other

## 2013-09-19 ENCOUNTER — Telehealth: Payer: Self-pay | Admitting: *Deleted

## 2013-09-19 NOTE — Telephone Encounter (Signed)
Patient's daughter left a message stating patient has been "cold, tired and spending a lot of time in bed". Pt coming in for lab/injection on Monday- she wants to make sure Dr Alvy Bimler sees the lab work. Called daughter back to let her know to take him to ED this weekend if he gets worse. She states she doesn't think he will need to go to ED, just wanted to make sure we saw labs Monday.

## 2013-09-22 ENCOUNTER — Encounter (HOSPITAL_COMMUNITY): Payer: Self-pay | Admitting: Emergency Medicine

## 2013-09-22 ENCOUNTER — Telehealth: Payer: Self-pay | Admitting: *Deleted

## 2013-09-22 ENCOUNTER — Other Ambulatory Visit: Payer: Medicare Other

## 2013-09-22 ENCOUNTER — Observation Stay (HOSPITAL_COMMUNITY)
Admission: EM | Admit: 2013-09-22 | Discharge: 2013-09-23 | Disposition: A | Discharge status: Home / Self Care | Payer: Medicare Other | Attending: Internal Medicine | Admitting: Internal Medicine

## 2013-09-22 ENCOUNTER — Ambulatory Visit: Payer: Medicare Other

## 2013-09-22 DIAGNOSIS — Z87891 Personal history of nicotine dependence: Secondary | ICD-10-CM | POA: Insufficient documentation

## 2013-09-22 DIAGNOSIS — I252 Old myocardial infarction: Secondary | ICD-10-CM | POA: Insufficient documentation

## 2013-09-22 DIAGNOSIS — I10 Essential (primary) hypertension: Secondary | ICD-10-CM | POA: Diagnosis present

## 2013-09-22 DIAGNOSIS — N039 Chronic nephritic syndrome with unspecified morphologic changes: Secondary | ICD-10-CM

## 2013-09-22 DIAGNOSIS — J449 Chronic obstructive pulmonary disease, unspecified: Secondary | ICD-10-CM | POA: Insufficient documentation

## 2013-09-22 DIAGNOSIS — D72819 Decreased white blood cell count, unspecified: Secondary | ICD-10-CM | POA: Insufficient documentation

## 2013-09-22 DIAGNOSIS — I69920 Aphasia following unspecified cerebrovascular disease: Secondary | ICD-10-CM | POA: Insufficient documentation

## 2013-09-22 DIAGNOSIS — Z888 Allergy status to other drugs, medicaments and biological substances status: Secondary | ICD-10-CM | POA: Insufficient documentation

## 2013-09-22 DIAGNOSIS — D631 Anemia in chronic kidney disease: Secondary | ICD-10-CM

## 2013-09-22 DIAGNOSIS — I9589 Other hypotension: Secondary | ICD-10-CM | POA: Insufficient documentation

## 2013-09-22 DIAGNOSIS — Z79899 Other long term (current) drug therapy: Secondary | ICD-10-CM | POA: Insufficient documentation

## 2013-09-22 DIAGNOSIS — Z91013 Allergy to seafood: Secondary | ICD-10-CM | POA: Insufficient documentation

## 2013-09-22 DIAGNOSIS — Z7982 Long term (current) use of aspirin: Secondary | ICD-10-CM | POA: Insufficient documentation

## 2013-09-22 DIAGNOSIS — E119 Type 2 diabetes mellitus without complications: Secondary | ICD-10-CM | POA: Diagnosis present

## 2013-09-22 DIAGNOSIS — J4489 Other specified chronic obstructive pulmonary disease: Secondary | ICD-10-CM | POA: Diagnosis present

## 2013-09-22 DIAGNOSIS — D696 Thrombocytopenia, unspecified: Secondary | ICD-10-CM

## 2013-09-22 DIAGNOSIS — I129 Hypertensive chronic kidney disease with stage 1 through stage 4 chronic kidney disease, or unspecified chronic kidney disease: Secondary | ICD-10-CM | POA: Insufficient documentation

## 2013-09-22 DIAGNOSIS — N184 Chronic kidney disease, stage 4 (severe): Secondary | ICD-10-CM | POA: Insufficient documentation

## 2013-09-22 DIAGNOSIS — D61818 Other pancytopenia: Secondary | ICD-10-CM | POA: Insufficient documentation

## 2013-09-22 DIAGNOSIS — N183 Chronic kidney disease, stage 3 (moderate): Secondary | ICD-10-CM

## 2013-09-22 DIAGNOSIS — D649 Anemia, unspecified: Secondary | ICD-10-CM

## 2013-09-22 DIAGNOSIS — I2510 Atherosclerotic heart disease of native coronary artery without angina pectoris: Secondary | ICD-10-CM | POA: Diagnosis present

## 2013-09-22 DIAGNOSIS — M109 Gout, unspecified: Principal | ICD-10-CM | POA: Diagnosis present

## 2013-09-22 DIAGNOSIS — Z87898 Personal history of other specified conditions: Secondary | ICD-10-CM | POA: Insufficient documentation

## 2013-09-22 DIAGNOSIS — Z794 Long term (current) use of insulin: Secondary | ICD-10-CM | POA: Insufficient documentation

## 2013-09-22 LAB — BASIC METABOLIC PANEL
BUN: 34 mg/dL — ABNORMAL HIGH (ref 6–23)
CO2: 28 meq/L (ref 19–32)
Calcium: 8.9 mg/dL (ref 8.4–10.5)
Chloride: 96 mEq/L (ref 96–112)
Creatinine, Ser: 2.19 mg/dL — ABNORMAL HIGH (ref 0.50–1.35)
GFR calc Af Amer: 30 mL/min — ABNORMAL LOW (ref >90)
GFR calc non Af Amer: 26 mL/min — ABNORMAL LOW (ref >90)
GLUCOSE: 108 mg/dL — AB (ref 70–99)
Potassium: 4.2 mEq/L (ref 3.7–5.3)
SODIUM: 139 meq/L (ref 137–147)

## 2013-09-22 LAB — CBC WITH DIFFERENTIAL/PLATELET
Basophils Absolute: 0.1 10*3/uL (ref 0.0–0.1)
Basophils Relative: 1 % (ref 0–1)
Eosinophils Absolute: 0 10*3/uL (ref 0.0–0.7)
Eosinophils Relative: 0 % (ref 0–5)
HCT: 33.3 % — ABNORMAL LOW (ref 39.0–52.0)
HEMOGLOBIN: 10.2 g/dL — AB (ref 13.0–17.0)
LYMPHS ABS: 1.3 10*3/uL (ref 0.7–4.0)
Lymphocytes Relative: 31 % (ref 12–46)
MCH: 28.9 pg (ref 26.0–34.0)
MCHC: 30.6 g/dL (ref 30.0–36.0)
MCV: 94.3 fL (ref 78.0–100.0)
MONOS PCT: 19 % — AB (ref 3–12)
Monocytes Absolute: 0.8 10*3/uL (ref 0.1–1.0)
NEUTROS ABS: 2.1 10*3/uL (ref 1.7–7.7)
NEUTROS PCT: 48 % (ref 43–77)
Platelets: 82 10*3/uL — ABNORMAL LOW (ref 150–400)
RBC: 3.53 MIL/uL — AB (ref 4.22–5.81)
RDW: 22.9 % — ABNORMAL HIGH (ref 11.5–15.5)
WBC: 4.3 10*3/uL (ref 4.0–10.5)

## 2013-09-22 LAB — GLUCOSE, CAPILLARY: Glucose-Capillary: 169 mg/dL — ABNORMAL HIGH (ref 70–99)

## 2013-09-22 MED ORDER — ASPIRIN-DIPYRIDAMOLE ER 25-200 MG PO CP12
1.0000 | ORAL_CAPSULE | Freq: Two times a day (BID) | ORAL | Status: DC
  Administered 2013-09-22 – 2013-09-23 (×2): 1 via ORAL
  Filled 2013-09-22 (×3): qty 1

## 2013-09-22 MED ORDER — INSULIN NPH (HUMAN) (ISOPHANE) 100 UNIT/ML ~~LOC~~ SUSP
6.0000 [IU] | Freq: Two times a day (BID) | SUBCUTANEOUS | Status: DC
  Filled 2013-09-22: qty 10

## 2013-09-22 MED ORDER — LATANOPROST 0.005 % OP SOLN
1.0000 [drp] | Freq: Every day | OPHTHALMIC | Status: DC
  Administered 2013-09-22: 1 [drp] via OPHTHALMIC
  Filled 2013-09-22: qty 2.5

## 2013-09-22 MED ORDER — METOPROLOL TARTRATE 12.5 MG HALF TABLET
12.5000 mg | ORAL_TABLET | Freq: Two times a day (BID) | ORAL | Status: DC
  Filled 2013-09-22 (×3): qty 1

## 2013-09-22 MED ORDER — PANTOPRAZOLE SODIUM 40 MG PO TBEC
40.0000 mg | DELAYED_RELEASE_TABLET | Freq: Every day | ORAL | Status: DC
  Administered 2013-09-22 – 2013-09-23 (×2): 40 mg via ORAL
  Filled 2013-09-22 (×2): qty 1

## 2013-09-22 MED ORDER — FINASTERIDE 5 MG PO TABS
5.0000 mg | ORAL_TABLET | Freq: Every day | ORAL | Status: DC
  Administered 2013-09-22 – 2013-09-23 (×2): 5 mg via ORAL
  Filled 2013-09-22 (×2): qty 1

## 2013-09-22 MED ORDER — INSULIN ASPART 100 UNIT/ML ~~LOC~~ SOLN: 0.0000 [IU] | Freq: Every day | SUBCUTANEOUS | Status: DC

## 2013-09-22 MED ORDER — METHYLPREDNISOLONE SODIUM SUCC 125 MG IJ SOLR
60.0000 mg | Freq: Four times a day (QID) | INTRAMUSCULAR | Status: DC
  Administered 2013-09-22 – 2013-09-23 (×3): 60 mg via INTRAVENOUS
  Filled 2013-09-22 (×2): qty 0.96
  Filled 2013-09-22: qty 2
  Filled 2013-09-22 (×4): qty 0.96

## 2013-09-22 MED ORDER — NITROGLYCERIN 0.4 MG SL SUBL: 0.4000 mg | SUBLINGUAL_TABLET | SUBLINGUAL | Status: DC | PRN

## 2013-09-22 MED ORDER — OXYCODONE-ACETAMINOPHEN 5-325 MG PO TABS: 1.0000 | ORAL_TABLET | Freq: Four times a day (QID) | ORAL | Status: DC | PRN

## 2013-09-22 MED ORDER — MORPHINE SULFATE 2 MG/ML IJ SOLN: 2.0000 mg | INTRAMUSCULAR | Status: DC | PRN

## 2013-09-22 MED ORDER — ACETAMINOPHEN 325 MG PO TABS: 650.0000 mg | ORAL_TABLET | Freq: Four times a day (QID) | ORAL | Status: DC | PRN

## 2013-09-22 MED ORDER — ONDANSETRON HCL 4 MG PO TABS: 4.0000 mg | ORAL_TABLET | Freq: Four times a day (QID) | ORAL | Status: DC | PRN

## 2013-09-22 MED ORDER — VITAMIN D3 25 MCG (1000 UNIT) PO TABS
1000.0000 [IU] | ORAL_TABLET | Freq: Every day | ORAL | Status: DC
  Administered 2013-09-22 – 2013-09-23 (×2): 1000 [IU] via ORAL
  Filled 2013-09-22 (×2): qty 1

## 2013-09-22 MED ORDER — ACETAMINOPHEN 650 MG RE SUPP: 650.0000 mg | Freq: Four times a day (QID) | RECTAL | Status: DC | PRN

## 2013-09-22 MED ORDER — HYDROXYZINE HCL 25 MG PO TABS
25.0000 mg | ORAL_TABLET | Freq: Four times a day (QID) | ORAL | Status: DC | PRN
  Filled 2013-09-22: qty 1

## 2013-09-22 MED ORDER — BRIMONIDINE TARTRATE 0.15 % OP SOLN
1.0000 [drp] | Freq: Two times a day (BID) | OPHTHALMIC | Status: DC
  Administered 2013-09-22 – 2013-09-23 (×2): 1 [drp] via OPHTHALMIC
  Filled 2013-09-22: qty 5

## 2013-09-22 MED ORDER — ONDANSETRON HCL 4 MG/2ML IJ SOLN
4.0000 mg | Freq: Four times a day (QID) | INTRAMUSCULAR | Status: DC | PRN
Start: 2013-09-22 — End: 2013-09-23

## 2013-09-22 MED ORDER — FUROSEMIDE 40 MG PO TABS
40.0000 mg | ORAL_TABLET | Freq: Every day | ORAL | Status: DC
  Administered 2013-09-22 – 2013-09-23 (×2): 40 mg via ORAL
  Filled 2013-09-22 (×2): qty 1

## 2013-09-22 MED ORDER — SODIUM CHLORIDE 0.9 % IV SOLN
INTRAVENOUS | Status: DC
  Administered 2013-09-22: 17:00:00 via INTRAVENOUS

## 2013-09-22 MED ORDER — MORPHINE SULFATE 4 MG/ML IJ SOLN
4.0000 mg | Freq: Once | INTRAMUSCULAR | Status: AC
  Administered 2013-09-22: 4 mg via INTRAVENOUS
  Filled 2013-09-22: qty 1

## 2013-09-22 MED ORDER — HEPARIN SODIUM (PORCINE) 5000 UNIT/ML IJ SOLN
5000.0000 [IU] | Freq: Three times a day (TID) | INTRAMUSCULAR | Status: DC
  Administered 2013-09-22 – 2013-09-23 (×3): 5000 [IU] via SUBCUTANEOUS
  Filled 2013-09-22 (×5): qty 1

## 2013-09-22 MED ORDER — PREDNISONE 20 MG PO TABS
40.0000 mg | ORAL_TABLET | Freq: Once | ORAL | Status: AC
  Administered 2013-09-22: 40 mg via ORAL
  Filled 2013-09-22: qty 2

## 2013-09-22 MED ORDER — INSULIN NPH (HUMAN) (ISOPHANE) 100 UNIT/ML ~~LOC~~ SUSP
6.0000 [IU] | Freq: Two times a day (BID) | SUBCUTANEOUS | Status: DC
  Administered 2013-09-22 – 2013-09-23 (×2): 12 [IU] via SUBCUTANEOUS
  Filled 2013-09-22: qty 10

## 2013-09-22 MED ORDER — INSULIN ASPART 100 UNIT/ML ~~LOC~~ SOLN
0.0000 [IU] | Freq: Three times a day (TID) | SUBCUTANEOUS | Status: DC
  Administered 2013-09-23: 7 [IU] via SUBCUTANEOUS
  Administered 2013-09-23: 4 [IU] via SUBCUTANEOUS

## 2013-09-22 MED ORDER — COLCHICINE 0.6 MG PO TABS
0.6000 mg | ORAL_TABLET | Freq: Two times a day (BID) | ORAL | Status: DC
  Administered 2013-09-22: 0.6 mg via ORAL
  Filled 2013-09-22 (×3): qty 1

## 2013-09-22 NOTE — ED Notes (Signed)
Initial Contact - pt to [facility]5 with c/o pain to RUE and RLE.  Pt with hx gout.  Pt difficult to assess 2/2 aphasia s/p old CVA, pt also with old R sided weakness.  +csm/+pulses, mild swelling noted to R wrist.  Skin PWD.  Pt denies pain elsewhere.  MAEI.  RR even/un-lab.  Pt denies CP/SOB.  Changed to hospital gown, NAD, awaiting family arrival and EDP eval.

## 2013-09-22 NOTE — ED Notes (Signed)
Bed: WE99 Expected date:  Expected time:  Means of arrival:  Comments: ptar gout

## 2013-09-22 NOTE — ED Notes (Signed)
Bed: [id number] Expected date:  Expected time:  Means of arrival:  Comments: Room 5

## 2013-09-22 NOTE — Telephone Encounter (Signed)
Daughter called to say patient has slid out of bed and they can't get him up. They are going to take him to the [facility] ED. Feel it might be a severe gout attack

## 2013-09-22 NOTE — H&P (Signed)
[facility] History and Physical  [name] WIO:[id number] DOB: [birth year] DOA: 09/22/2013  Referring physician: Emergency Department PCP: Simona Huh, MD  Specialists:   Chief Complaint: foot/arm pain  HPI: [name] is a 80 y.o. male  With a hx of cad, anemia, hx of diastolic heart failure, and gout who presents with intractable marked gout-related pain not improve with colchicine, opiates, and prednisone started as an outpatient. The patient normally ambulates with a walker but has been unable to ambulate. In the ed, the patient was given morphine and one dose of prednisone 40mg  with some improvement noted, albeit not yet at baseline. The hospitalists were consulted for consideration for admission.  Review of Systems:  Per above, the remainder of the 10pt ros reviewed and are neg  Past Medical History  Diagnosis Date  . Coronary artery disease   . History of pericardial effusion     Prior pericardial window for tamponade in 2007   . Chronic kidney disease (CKD), stage IV (severe)   . Cryptogenic cirrhosis of liver   . H/O: stroke     10/09/03/2005 treated with intra arterial TPA and clot removal with Merci device for MCA occlusion   . Chronic pleural effusion   . C O P D 12/22/2008  . Myocardial infarction 12/1994; 07/2004  . History of blood transfusion     "he's had several; going to get 2 units today" (08/19/2013)  . Family history of anesthesia complication     "2 daughters had trouble waking up & a little nausea w/it" (08/19/2013)  . Hypertension     "not on RX now" (08/19/2013)  . Collapsed lung     "RLL due to fluid from the abdomen" (08/19/2013)  . Exertional shortness of breath   . Stroke 05/2006    "had 30% of his communication skills damaged; left him w/aphasia; dysarthria" (08/19/2013)  . Type II diabetes mellitus 1994  . Arthritis     "right hand & wrist; feet; ankles" (08/19/2013)  . Gout     "recently dx'd" (08/19/2013)  . Depression   .  Anemia due to stage 4(moderate) chronic renal failure treated with erythropoietin     "gets a procrit shot q 4 wks" (08/19/2013)   Past Surgical History  Procedure Laterality Date  . Pericardial window  05/2006  . Hemorroidectomy  1970's  . Shoulder surgery Right 1970's    "cut out calcium deposits" (08/19/2013)  . Ankle arthroplasty Right 1990's?    "locked it into place; it was just bone on bone" (08/19/2013)  . Cataract extraction w/ intraocular lens  implant, bilateral Bilateral 2009  . Coronary angioplasty with stent placement  12/1994; 07/2004    "1+ 2" (08/19/2013)  . Cerebral angiogram  06/13/2006    with Merci clot retrieval and reperfusion performed by Dr. Robb Matar 06/21/2006 (08/19/2013)   Social History:  reports that he has quit smoking. His smoking use included Cigarettes. He has a 37.5 pack-year smoking history. He has never used smokeless tobacco. He reports that he does not drink alcohol or use illicit drugs.  where does patient live--home, ALF, SNF? and with whom if at home?  Can patient participate in ADLs?  Allergies  Allergen Reactions  . Shellfish Allergy Anaphylaxis  . Diltiazem Diarrhea  . Ranolazine     dizziness   . Statins     Muscle pains  . Amlodipine Rash  . Clopidogrel Rash    History reviewed. No pertinent family history.  (be sure to complete)  Prior to Admission medications   Medication Sig Start Date End Date Taking? Authorizing Provider  allopurinol (ZYLOPRIM) 100 MG tablet Take 100 mg by mouth at bedtime. 09/02/13  Yes Historical Provider, MD  brimonidine (ALPHAGAN) 0.15 % ophthalmic solution Place 1 drop into both eyes 2 (two) times daily.   Yes Historical Provider, MD  cholecalciferol (VITAMIN D) 1000 UNITS tablet Take 1,000 Units by mouth daily.   Yes Historical Provider, MD  colchicine 0.6 MG tablet Take 0.6 mg by mouth 2 (two) times daily. For gout attacks he takes it twice a day for 4 days.   Yes Historical Provider, MD   dipyridamole-aspirin (AGGRENOX) 200-25 MG per 12 hr capsule Take 1 capsule by mouth 2 (two) times daily.   Yes Historical Provider, MD  Epoetin Alfa (PROCRIT IJ) Inject 20,000 Units as directed every 7 (seven) days. For Renal failure. Dosed by Dr. Alvy Bimler.   Yes Historical Provider, MD  finasteride (PROSCAR) 5 MG tablet Take 5 mg by mouth daily.   Yes Historical Provider, MD  furosemide (LASIX) 40 MG tablet Take 40 mg by mouth daily.   Yes Historical Provider, MD  hydrOXYzine (ATARAX/VISTARIL) 25 MG tablet Take 25 mg by mouth every 6 (six) hours as needed for itching.   Yes Historical Provider, MD  insulin NPH (HUMULIN N,NOVOLIN N) 100 UNIT/ML injection Inject 6-12 Units into the skin 2 (two) times daily at 8 am and 10 pm.    Yes Historical Provider, MD  latanoprost (XALATAN) 0.005 % ophthalmic solution Place 1 drop into both eyes at bedtime.   Yes Historical Provider, MD  metoprolol tartrate (LOPRESSOR) 25 MG tablet Take 12.5 mg by mouth 2 (two) times daily.   Yes Historical Provider, MD  nitroGLYCERIN (NITROSTAT) 0.4 MG SL tablet Place 0.4 mg under the tongue every 5 (five) minutes as needed for chest pain.   Yes Historical Provider, MD  omeprazole (PRILOSEC) 20 MG capsule Take 20 mg by mouth daily.   Yes Historical Provider, MD  oxyCODONE-acetaminophen (PERCOCET/ROXICET) 5-325 MG per tablet Take 1 tablet by mouth every 6 (six) hours as needed for moderate pain.   Yes Historical Provider, MD  predniSONE (DELTASONE) 5 MG tablet Take 5 mg by mouth daily with breakfast.   Yes Historical Provider, MD   Physical Exam: Filed Vitals:   09/22/13 1239 09/22/13 1245 09/22/13 1437 09/22/13 1500  BP:  115/73 114/45 116/51  Pulse:  86 97 99  Temp:  99 F (37.2 C)    TempSrc:  Oral    SpO2: 94% 99% 94% 95%     General:  Awake, in nad  Eyes: PERRL B  ENT: membranes moist, dentition fair  Neck: trachea midline, neck supple  Cardiovascular: regular, s1, s2  Respiratory: normal resp effort, no  wheezing  Abdomen: soft, nondistended  Skin: normal skin turgor, multiple areas of ecchymosis  Musculoskeletal: perfused, no clubbing, pain and swelling over R wrist, hand, and R ankle  Psychiatric: mood/affect normal // no auditory/visual hallucinations  Neurologic: cn2-12 grossly intact, decreased strength over R side  Labs on Admission:  Basic Metabolic Panel:  Recent Labs Lab 09/22/13 1400  NA 139  K 4.2  CL 96  CO2 28  GLUCOSE 108*  BUN 34*  CREATININE 2.19*  CALCIUM 8.9   Liver Function Tests: No results found for this basename: AST, ALT, ALKPHOS, BILITOT, PROT, ALBUMIN,  in the last 168 hours No results found for this basename: LIPASE, AMYLASE,  in the last 168 hours No results found  for this basename: AMMONIA,  in the last 168 hours CBC:  Recent Labs Lab 09/22/13 1400  WBC 4.3  NEUTROABS 2.1  HGB 10.2*  HCT 33.3*  MCV 94.3  PLT 82*   Cardiac Enzymes: No results found for this basename: CKTOTAL, CKMB, CKMBINDEX, TROPONINI,  in the last 168 hours  BNP (last 3 results) No results found for this basename: PROBNP,  in the last 8760 hours CBG: No results found for this basename: GLUCAP,  in the last 168 hours  Radiological Exams on Admission: No results found.   Assessment/Plan Principal Problem:   Gout Active Problems:   C O P D   CAD (coronary artery disease)   HTN (hypertension)   Diabetes  1. Gout flare 1. Failed outpt treatment 2. Will admit to med-surg 3. Cont on solumedrol and analgesics 4. Avoid NSAIDs given chronic renal disease 5. Consider pt/ot 2. COPD 1. Stable 2. No wheezing 3. Minimal O2 support 3. CAD 1. Stable 2. Cont meds 4. HTN 1. Stable and controlled 2. Cont meds 5. Hx of CVA 1. Stable 2. On aggrenox 6. Diabetes 1. Glucose currently stable and controlled 2. Will cont on SSI 3. Cont home insulin 7. DVT prophylaxis 1. Heparin subQ 8. CKD 1. Stable baseline renal function 2. Pt's wishes are for no  dialysis  Code Status: DNR - confirmed with pt's daughter at bedside Family Communication: Pt and pt's daughter in room Disposition Plan: Pending  Time spent: 72min  Shail Urbas, [facility] Pager [phone]  If 7PM-7AM, please contact night-coverage [email or web address] Password [facility] 09/22/2013, 4:30 PM

## 2013-09-22 NOTE — ED Provider Notes (Signed)
Complains of right wrist pain and right ankle pain onset 2 days ago treated with Percocet and colchicine, without relief. He has been unable to walk and unable to get to the bathroom today. Pain is worse with weightbearing or applying pressure. Pain is typical of gout he's had in the past. On exam patient is in no distress right upper extremity mild he red and tender at the wrist. Radial pulse 2+. Right lower extremity red warm tender at lateral malleolus. DP pulses 1+. All other extremities no redness or tenderness neurovascularly intact.  Orlie Dakin, MD 09/22/13 [phone]

## 2013-09-22 NOTE — ED Notes (Signed)
inpt MD at bs for consult.

## 2013-09-22 NOTE — ED Provider Notes (Signed)
CSN: [id number]     Arrival date & time 09/22/13  1238 History   First MD Initiated Contact with Patient 09/22/13 1244     Chief Complaint  Patient presents with  . Gout    HPI  [name] is a 80 y.o. male with a PMH of gout, arthritis, type II DM, CAD, hx of pericardial effusion, CKD, liver cirrhosis, stroke, COPD, HTN, SOB, depression, anemia, who presents to the ED for evaluation of gout.  History was provided by the patient and his daughters (caregiver and POA).  Patient is a poor historian.  History limited by patient's aphasia from previous CVA.  Patient has had gout attacks for the past few months with the onset of gout in 05/2013.  Patient has been managed for his gout by his PCP with allopurinol, percocet, and colchicine.  Patient's pain has worsened over the past two days. Pain started in the right wrist and has now migrated to his right foot. Pain is constant and worse with movement. Patient is on day two of colchicine and his last dose of Percocet was last night. Daughters brought the patient to the ED because he is unable to walk due to severe pain. Patient uses a walker and cannot use this due to the severe pain from his wrist. No injuries/trauma. Pain is similar to gout pain he has had in the past with no acute changes. No fevers, chills, change in appetite/activity, chest pain, SOB, abdominal pain, nausea, emesis, leg edema, headache, dizziness or lightheadedness.    Past Medical History  Diagnosis Date  . Coronary artery disease   . History of pericardial effusion     Prior pericardial window for tamponade in 2007   . Chronic kidney disease (CKD), stage IV (severe)   . Cryptogenic cirrhosis of liver   . H/O: stroke     10/09/03/2005 treated with intra arterial TPA and clot removal with Merci device for MCA occlusion   . Chronic pleural effusion   . C O P D 12/22/2008  . Myocardial infarction 12/1994; 07/2004  . History of blood transfusion     "he's had several; going to  get 2 units today" (08/19/2013)  . Family history of anesthesia complication     "2 daughters had trouble waking up & a little nausea w/it" (08/19/2013)  . Hypertension     "not on RX now" (08/19/2013)  . Collapsed lung     "RLL due to fluid from the abdomen" (08/19/2013)  . Exertional shortness of breath   . Stroke 05/2006    "had 30% of his communication skills damaged; left him w/aphasia; dysarthria" (08/19/2013)  . Type II diabetes mellitus 1994  . Arthritis     "right hand & wrist; feet; ankles" (08/19/2013)  . Gout     "recently dx'd" (08/19/2013)  . Depression   . Anemia due to stage 4(moderate) chronic renal failure treated with erythropoietin     "gets a procrit shot q 4 wks" (08/19/2013)   Past Surgical History  Procedure Laterality Date  . Pericardial window  05/2006  . Hemorroidectomy  1970's  . Shoulder surgery Right 1970's    "cut out calcium deposits" (08/19/2013)  . Ankle arthroplasty Right 1990's?    "locked it into place; it was just bone on bone" (08/19/2013)  . Cataract extraction w/ intraocular lens  implant, bilateral Bilateral 2009  . Coronary angioplasty with stent placement  12/1994; 07/2004    "1+ 2" (08/19/2013)  . Cerebral angiogram  06/13/2006  with Merci clot retrieval and reperfusion performed by Dr. Robb Matar 06/21/2006 (08/19/2013)   History reviewed. No pertinent family history. History  Substance Use Topics  . Smoking status: Former Smoker -- 0.75 packs/day for 50 years    Types: Cigarettes  . Smokeless tobacco: Never Used     Comment: 08/19/2013 "stopped smoking after 1st MI in 1996"  . Alcohol Use: No    Review of Systems  Constitutional: Negative for fever, chills, diaphoresis, activity change, appetite change and fatigue.  Respiratory: Negative for cough and shortness of breath.   Cardiovascular: Negative for chest pain and leg swelling.  Gastrointestinal: Negative for nausea, vomiting and abdominal pain.  Genitourinary:  Negative for dysuria.  Musculoskeletal: Positive for arthralgias, gait problem and joint swelling. Negative for back pain, myalgias and neck pain.  Skin: Positive for color change. Negative for wound.  Neurological: Negative for dizziness, weakness, light-headedness, numbness and headaches.    Allergies  Shellfish allergy; Amlodipine; Crestor; Diltiazem; Ranolazine; and Clopidogrel  Home Medications   Current Outpatient Rx  Name  Route  Sig  Dispense  Refill  . allopurinol (ZYLOPRIM) 100 MG tablet   Oral   Take 100 mg by mouth at bedtime.         . brimonidine (ALPHAGAN) 0.15 % ophthalmic solution   Both Eyes   Place 1 drop into both eyes 2 (two) times daily.         . cholecalciferol (VITAMIN D) 1000 UNITS tablet   Oral   Take 1,000 Units by mouth daily.         Marland Kitchen dipyridamole-aspirin (AGGRENOX) 200-25 MG per 12 hr capsule   Oral   Take 1 capsule by mouth 2 (two) times daily.         Marland Kitchen doxepin (SINEQUAN) 10 MG capsule   Oral   Take 10 mg by mouth at bedtime.          Marland Kitchen Epoetin Alfa (PROCRIT IJ)   Injection   Inject as directed every 30 (thirty) days.         . finasteride (PROSCAR) 5 MG tablet   Oral   Take 5 mg by mouth daily.         . furosemide (LASIX) 40 MG tablet   Oral   Take 1 tablet (40 mg total) by mouth 2 (two) times daily.   30 tablet   0   . hydrOXYzine (ATARAX/VISTARIL) 25 MG tablet   Oral   Take 25 mg by mouth every 6 (six) hours as needed for itching.         . insulin NPH (HUMULIN N,NOVOLIN N) 100 UNIT/ML injection   Subcutaneous   Inject 6-12 Units into the skin 2 (two) times daily at 8 am and 10 pm. 96-120 = 6 units 121-145 = 8 units 146-170 = 10 units 171-200 = 12 units         . latanoprost (XALATAN) 0.005 % ophthalmic solution   Both Eyes   Place 1 drop into both eyes at bedtime.         . metoprolol tartrate (LOPRESSOR) 12.5 mg TABS tablet   Oral   Take 0.5 tablets (12.5 mg total) by mouth 2 (two) times  daily.   60 tablet   0   . nitroGLYCERIN (NITROSTAT) 0.4 MG SL tablet   Sublingual   Place 0.4 mg under the tongue every 5 (five) minutes as needed for chest pain.         . predniSONE (DELTASONE) 5  MG tablet   Oral   Take 5 mg by mouth daily with breakfast.          BP 116/51  Pulse 99  Temp(Src) 99 F (37.2 C) (Oral)  SpO2 95%  Filed Vitals:   09/22/13 1239 09/22/13 1245 09/22/13 1437 09/22/13 1500  BP:  115/73 114/45 116/51  Pulse:  86 97 99  Temp:  99 F (37.2 C)    TempSrc:  Oral    SpO2: 94% 99% 94% 95%    Physical Exam  Nursing note and vitals reviewed. Constitutional: He is oriented to person, place, and time. He appears well-developed and well-nourished. No distress.  HENT:  Head: Normocephalic and atraumatic.  Right Ear: External ear normal.  Left Ear: External ear normal.  Nose: Nose normal.  Mouth/Throat: Oropharynx is clear and moist.  Eyes: Conjunctivae are normal. Right eye exhibits no discharge. Left eye exhibits no discharge.  Neck: Normal range of motion. Neck supple.  Cardiovascular: Normal rate, regular rhythm, normal heart sounds and intact distal pulses.  Exam reveals no gallop and no friction rub.   No murmur heard. Radial and dorsalis pedis pulses present bilaterally  Pulmonary/Chest: Effort normal and breath sounds normal. No respiratory distress. He has no wheezes. He has no rales. He exhibits no tenderness.  Abdominal: Soft. He exhibits no distension. There is no tenderness.  Musculoskeletal: Normal range of motion. He exhibits edema and tenderness.       Hands:      Feet:  ROM of the right wrist and right ankle limited due to pain.  Neurological: He is alert and oriented to person, place, and time.  Sensation intact. Expressive aphasia.  Skin: Skin is warm and dry. He is not diaphoretic.  Diffuse edema and mild erythema without well circumscribed borders to the right lateral dorsal wrist and right lateral malleolus.  No open wounds.  Diffuse tenderness to palpation to the right wrist and right lateral ankle.     ED Course  Procedures (including critical care time) Labs Review Labs Reviewed - No data to display Imaging Review No results found.  EKG Interpretation   None      Results for orders placed during the hospital encounter of 09/22/13  CBC WITH DIFFERENTIAL      Result Value Range   WBC 4.3  4.0 - 10.5 K/uL   RBC 3.53 (*) 4.22 - 5.81 MIL/uL   Hemoglobin 10.2 (*) 13.0 - 17.0 g/dL   HCT 33.3 (*) 39.0 - 52.0 %   MCV 94.3  78.0 - 100.0 fL   MCH 28.9  26.0 - 34.0 pg   MCHC 30.6  30.0 - 36.0 g/dL   RDW 22.9 (*) 11.5 - 15.5 %   Platelets 82 (*) 150 - 400 K/uL   Neutrophils Relative % 48  43 - 77 %   Neutro Abs 2.1  1.7 - 7.7 K/uL   Lymphocytes Relative 31  12 - 46 %   Lymphs Abs 1.3  0.7 - 4.0 K/uL   Monocytes Relative 19 (*) 3 - 12 %   Monocytes Absolute 0.8  0.1 - 1.0 K/uL   Eosinophils Relative 0  0 - 5 %   Eosinophils Absolute 0.0  0.0 - 0.7 K/uL   Basophils Relative 1  0 - 1 %   Basophils Absolute 0.1  0.0 - 0.1 K/uL   RBC Morphology ELLIPTOCYTES    BASIC METABOLIC PANEL      Result Value Range   Sodium 139  137 - 147 mEq/L   Potassium 4.2  3.7 - 5.3 mEq/L   Chloride 96  96 - 112 mEq/L   CO2 28  19 - 32 mEq/L   Glucose, Bld 108 (*) 70 - 99 mg/dL   BUN 34 (*) 6 - 23 mg/dL   Creatinine, Ser 2.19 (*) 0.50 - 1.35 mg/dL   Calcium 8.9  8.4 - 10.5 mg/dL   GFR calc non Af Amer 26 (*) >90 mL/min   GFR calc Af Amer 30 (*) >90 mL/min    MDM   KALIJAH ZEISS is a 80 y.o. male with a PMH of gout, arthritis, type II DM, CAD, hx of pericardial effusion, CKD, liver cirrhosis, stroke, COPD, HTN, SOB, depression, anemia, who presents to the ED for evaluation of gout.    Rechecks  3:00 PM = Pain improved.  Patient has been sleeping per family.  Patient declines further pain medication.   4:00 PM = Spoke with family who states patient cannot be safely discharged home in his condition.  Patient and family  in agreement with admission.    Consults  4:15 PM = Spoke with Dr. Wyline Copas who accepts patient for admission.     Patient admitted for management of a gout attack.  Pain controlled during ED visit however patient unable to be discharged due to inability to care for himself.  He uses a walker at home and cannot use the walker/ambulate due to right wrist and foot pain.  Patient has gout in his right wrist and right ankle, which is unchanged from his previous gout attacks in the past.  No fever or wounds.  Patient non-toxic.  No leukocytosis.  Doubt septic joints.  Patient also has anemia which appears to be improved from baseline. Chronic thrombocytopenia as well.  He also has chronic kidney disease with his creatinine at baseline.  Patient and family in agreement with admission and plan.     Final impressions: 1. Gout attack   2. Anemia   3. Chronic kidney disease (CKD), stage IV (severe)   4. Thrombocytopenia     Mercy Moore PA-C   This patient was discussed with Dr. Elicia Lamp, PA-C 09/22/13 2129

## 2013-09-22 NOTE — ED Notes (Signed)
Pt from home with c/o gout flare up, c/o pain to RUE and R foot, 10/10, x2 days.  A+Ox4, denies other complaints.

## 2013-09-23 DIAGNOSIS — D649 Anemia, unspecified: Secondary | ICD-10-CM

## 2013-09-23 LAB — CBC
HCT: 32.1 % — ABNORMAL LOW (ref 39.0–52.0)
HEMOGLOBIN: 9.7 g/dL — AB (ref 13.0–17.0)
MCH: 28.6 pg (ref 26.0–34.0)
MCHC: 30.2 g/dL (ref 30.0–36.0)
MCV: 94.7 fL (ref 78.0–100.0)
Platelets: 82 10*3/uL — ABNORMAL LOW (ref 150–400)
RBC: 3.39 MIL/uL — ABNORMAL LOW (ref 4.22–5.81)
RDW: 22.9 % — AB (ref 11.5–15.5)
WBC: 1.6 10*3/uL — ABNORMAL LOW (ref 4.0–10.5)

## 2013-09-23 LAB — COMPREHENSIVE METABOLIC PANEL
ALK PHOS: 100 U/L (ref 39–117)
ALT: 10 U/L (ref 0–53)
AST: 45 U/L — AB (ref 0–37)
Albumin: 2.8 g/dL — ABNORMAL LOW (ref 3.5–5.2)
BUN: 45 mg/dL — ABNORMAL HIGH (ref 6–23)
CALCIUM: 8 mg/dL — AB (ref 8.4–10.5)
CO2: 20 mEq/L (ref 19–32)
Chloride: 97 mEq/L (ref 96–112)
Creatinine, Ser: 2.13 mg/dL — ABNORMAL HIGH (ref 0.50–1.35)
GFR calc non Af Amer: 27 mL/min — ABNORMAL LOW (ref >90)
GFR, EST AFRICAN AMERICAN: 31 mL/min — AB (ref >90)
GLUCOSE: 225 mg/dL — AB (ref 70–99)
POTASSIUM: 4.6 meq/L (ref 3.7–5.3)
SODIUM: 139 meq/L (ref 137–147)
TOTAL PROTEIN: 6 g/dL (ref 6.0–8.3)
Total Bilirubin: 1.9 mg/dL — ABNORMAL HIGH (ref 0.3–1.2)

## 2013-09-23 LAB — GLUCOSE, CAPILLARY
GLUCOSE-CAPILLARY: 178 mg/dL — AB (ref 70–99)
Glucose-Capillary: 210 mg/dL — ABNORMAL HIGH (ref 70–99)

## 2013-09-23 MED ORDER — PREDNISONE 20 MG PO TABS: 60.0000 mg | ORAL_TABLET | Freq: Every day | ORAL | Status: AC

## 2013-09-23 MED ORDER — SODIUM CHLORIDE 0.9 % IV BOLUS (SEPSIS)
250.0000 mL | Freq: Once | INTRAVENOUS | Status: AC
  Administered 2013-09-23: 250 mL via INTRAVENOUS

## 2013-09-23 MED ORDER — PREDNISONE 50 MG PO TABS
60.0000 mg | ORAL_TABLET | Freq: Every day | ORAL | Status: DC
  Filled 2013-09-23: qty 1

## 2013-09-23 MED ORDER — COLCHICINE 0.6 MG PO TABS
0.6000 mg | ORAL_TABLET | ORAL | Status: DC
  Filled 2013-09-23: qty 1

## 2013-09-23 MED ORDER — COLCHICINE 0.6 MG PO TABS
0.6000 mg | ORAL_TABLET | Freq: Two times a day (BID) | ORAL | Status: DC
  Filled 2013-09-23: qty 1

## 2013-09-23 NOTE — Progress Notes (Signed)
[facility] PROGRESS NOTE  [name] [id number] DOB: 1926/05/10 DOA: 09/22/2013 PCP: Simona Huh, MD  Assessment/Plan: 1. Gout flare  1. Failed outpt treatment 2. Cont on solumedrol and analgesics and colchicine 3. Avoid NSAIDs given chronic renal disease 4. PT/OT consulted 2. COPD  1. Stable 2. No wheezing 3. Remains on minimal O2 support 3. CAD  1. Stable 2. Cont meds 4. HTN  1. Hypotensive this AM 2. Will hold metoprolol 12.34m bid, consider resuming when/if blood pressure would allow 5. Hx of CVA  1. Stable 2. On aggrenox 6. Diabetes  1. Glucose currently stable and controlled 2. Will cont on SSI 3. Cont home insulin 7. DVT prophylaxis  1. Heparin subQ 8. CKD  1. Stable baseline renal function 2. Pt's wishes are for no dialysis 9. Leukopenia/Pancytopenia 1. Hx of multiple myeloma 2. Question MDS 3. Patient had declined further onc work up/chemo 4. Monitor for now  Code Status: DNR Family Communication: Pt in room (indicate person spoken with, relationship, and if by phone, the number) Disposition Plan: Pending   Consultants:    Procedures:    Antibiotics:   (indicate start date, and stop date if known)  HPI/Subjective: Mildly hypotensive this AM.  Objective: Filed Vitals:   09/22/13 1630 09/22/13 1806 09/22/13 2209 09/23/13 0524  BP: 105/48 114/66 101/62 84/47  Pulse: 99 103 101 90  Temp:  98.1 F (36.7 C) 99.1 F (37.3 C) 97.4 F (36.3 C)  TempSrc:  Oral Oral Oral  Resp:  20 20 20   Height:  5' 6"  (1.676 m)    Weight:  72.122 kg (159 lb)    SpO2: 93% 95% 95% 96%    Intake/Output Summary (Last 24 hours) at 09/23/13 0921 Last data filed at 09/22/13 1800  Gross per 24 hour  Intake      0 ml  Output    125 ml  Net   -125 ml   Filed Weights   09/22/13 1806  Weight: 72.122 kg (159 lb)    Exam:   General:  Awake, in nad  Cardiovascular: regular, s1, s2  Respiratory: normal resp effort, no  wheezing  Abdomen: soft, nondistended  Musculoskeletal: perfuse, no clubbing, R sided wrist and ankle pain much improved   Data Reviewed: Basic Metabolic Panel:  Recent Labs Lab 09/22/13 1400 09/23/13 0440  NA 139 139  K 4.2 4.6  CL 96 97  CO2 28 20  GLUCOSE 108* 225*  BUN 34* 45*  CREATININE 2.19* 2.13*  CALCIUM 8.9 8.0*   Liver Function Tests:  Recent Labs Lab 09/23/13 0440  AST 45*  ALT 10  ALKPHOS 100  BILITOT 1.9*  PROT 6.0  ALBUMIN 2.8*   No results found for this basename: LIPASE, AMYLASE,  in the last 168 hours No results found for this basename: AMMONIA,  in the last 168 hours CBC:  Recent Labs Lab 09/22/13 1400 09/23/13 0440  WBC 4.3 1.6*  NEUTROABS 2.1  --   HGB 10.2* 9.7*  HCT 33.3* 32.1*  MCV 94.3 94.7  PLT 82* 82*   Cardiac Enzymes: No results found for this basename: CKTOTAL, CKMB, CKMBINDEX, TROPONINI,  in the last 168 hours BNP (last 3 results) No results found for this basename: PROBNP,  in the last 8760 hours CBG:  Recent Labs Lab 09/22/13 2206  GLUCAP 169*    No results found for this or any previous visit (from the past 240 hour(s)).   Studies: No results found.  Scheduled Meds: . brimonidine  1 drop Both Eyes BID  . cholecalciferol  1,000 Units Oral Daily  . colchicine  0.6 mg Oral Q24H  . dipyridamole-aspirin  1 capsule Oral BID  . finasteride  5 mg Oral Daily  . furosemide  40 mg Oral Daily  . heparin  5,000 Units Subcutaneous Q8H  . insulin aspart  0-20 Units Subcutaneous TID WC  . insulin aspart  0-5 Units Subcutaneous QHS  . insulin NPH Human  6-12 Units Subcutaneous BID AC & HS  . latanoprost  1 drop Both Eyes QHS  . methylPREDNISolone (SOLU-MEDROL) injection  60 mg Intravenous Q6H  . metoprolol tartrate  12.5 mg Oral BID  . pantoprazole  40 mg Oral Daily   Continuous Infusions:   Principal Problem:   Gout Active Problems:   C O P D   CAD (coronary artery disease)   HTN (hypertension)   Diabetes    Gout attack  Time spent: 78mn  [facility], [facility] Pager [phone] If 7PM-7AM, please contact night-coverage at [email or web address], password [facility]/27/2015, 9:21 AM  LOS: 1 day

## 2013-09-23 NOTE — Discharge Summary (Signed)
Physician Discharge Summary  [name]:[id number] DOB: 20-Feb-1926 DOA: 09/22/2013  PCP: Simona Huh, MD  Admit date: 09/22/2013 Discharge date: 09/23/2013  Time spent: 35 minutes  Recommendations for Outpatient Follow-up:  1. Follow up with PCP in 1-2 weeks 2. Would consider repeating CBC in 1-2 weeks  Discharge Diagnoses:  Principal Problem:   Gout Active Problems:   C O P D   CAD (coronary artery disease)   HTN (hypertension)   Diabetes   Gout attack   Discharge Condition: Improved  Diet recommendation: diabetic  Filed Weights   09/22/13 1806  Weight: 72.122 kg (159 lb)    History of present illness:  [name] is a 80 y.o. male  With a hx of cad, anemia, hx of diastolic heart failure, and gout who presents with intractable marked gout-related pain not improve with colchicine, opiates, and prednisone started as an outpatient. The patient normally ambulates with a walker but has been unable to ambulate. In the ed, the patient was given morphine and one dose of prednisone 24m with some improvement noted, albeit not yet at baseline. The hospitalists were consulted for consideration for admission.  Hospital Course:  Gout flare  1. Failed outpt treatment 2. Cont on solumedrol and analgesics and colchicine 3. Cont to avoid NSAIDs given chronic renal disease 4. Overnight, the patient returned to near baseline state. Would complete steroid taper as an outpatient COPD  1. Stable 2. No wheezing 3. Remains on minimal O2 support CAD  1. Stable 2. Cont meds HTN  1. Hypotensive on day of discharge 2. Will hold metoprolol 12.559mbid, consider resuming when/if blood pressure would allow in the future Hx of CVA  1. Stable 2. On aggrenox Diabetes  1. Glucose currently stable and controlled 2. Will cont on SSI 3. Cont home insulin DVT prophylaxis  1. Heparin subQ CKD  1. Stable baseline renal function 2. Pt's wishes are for no  dialysis Leukopenia/Pancytopenia  1. Hx of multiple myeloma 2. Question MDS 3. Patient had declined further onc work up/chemo 4. Would have patient follow up closely as an outpatient  Discharge Exam: Filed Vitals:   09/22/13 1630 09/22/13 1806 09/22/13 2209 09/23/13 0524  BP: 105/48 114/66 101/62 84/47  Pulse: 99 103 101 90  Temp:  98.1 F (36.7 C) 99.1 F (37.3 C) 97.4 F (36.3 C)  TempSrc:  Oral Oral Oral  Resp:  20 20 20   Height:  5' 6"  (1.676 m)    Weight:  72.122 kg (159 lb)    SpO2: 93% 95% 95% 96%    General: awake, in nad Cardiovascular: regular, s1, s2 Respiratory: normal resp effort, no wheezing  Discharge Instructions       Future Appointments Provider Department Dept Phone   09/29/2013 11:00 AM [facility] [phone] 09/29/2013 11:30 AM [facility] [phone] 10/06/2013 11:30 AM [facility] [phone] 10/06/2013 12:00 PM Ni[facility]ath LarkMD [facility] [phone] 10/06/2013 1:00 PM [phone] 10/13/2013 11:00 AM [facility] [phone] 10/13/2013 11:30 AM [facility] Nurse [facility] [phone]     Medication List    STOP taking these medications       allopurinol 100  MG tablet  Commonly known as:  ZYLOPRIM     metoprolol tartrate 25 MG tablet  Commonly known as:  LOPRESSOR      TAKE these medications       brimonidine 0.15 % ophthalmic solution  Commonly known as:  ALPHAGAN  Place 1 drop into both eyes 2 (two) times daily.     cholecalciferol 1000 UNITS tablet  Commonly known as:  VITAMIN D  Take 1,000 Units by mouth daily.     colchicine 0.6 MG tablet  Take 0.6 mg by mouth 2 (two) times daily. For gout  attacks he takes it twice a day for 4 days.     dipyridamole-aspirin 200-25 MG per 12 hr capsule  Commonly known as:  AGGRENOX  Take 1 capsule by mouth 2 (two) times daily.     finasteride 5 MG tablet  Commonly known as:  PROSCAR  Take 5 mg by mouth daily.     furosemide 40 MG tablet  Commonly known as:  LASIX  Take 40 mg by mouth daily.     hydrOXYzine 25 MG tablet  Commonly known as:  ATARAX/VISTARIL  Take 25 mg by mouth every 6 (six) hours as needed for itching.     insulin NPH Human 100 UNIT/ML injection  Commonly known as:  HUMULIN N,NOVOLIN N  Inject 6-12 Units into the skin 2 (two) times daily at 8 am and 10 pm.     latanoprost 0.005 % ophthalmic solution  Commonly known as:  XALATAN  Place 1 drop into both eyes at bedtime.     nitroGLYCERIN 0.4 MG SL tablet  Commonly known as:  NITROSTAT  Place 0.4 mg under the tongue every 5 (five) minutes as needed for chest pain.     omeprazole 20 MG capsule  Commonly known as:  PRILOSEC  Take 20 mg by mouth daily.     oxyCODONE-acetaminophen 5-325 MG per tablet  Commonly known as:  PERCOCET/ROXICET  Take 1 tablet by mouth every 6 (six) hours as needed for moderate pain.     predniSONE 20 MG tablet  Commonly known as:  DELTASONE  Take 3 tablets (60 mg total) by mouth daily with breakfast.  Start taking on:  09/24/2013     PROCRIT IJ  Inject 20,000 Units as directed every 7 (seven) days. For Renal failure. Dosed by Dr. Alvy Bimler.       Allergies  Allergen Reactions  . Shellfish Allergy Anaphylaxis  . Diltiazem Diarrhea  . Ranolazine     dizziness   . Statins     Muscle pains  . Amlodipine Rash  . Clopidogrel Rash   Follow-up Information   Follow up with Simona Huh, MD. Schedule an appointment as soon as possible for a visit in 1 week.   Specialty:  Family Medicine   Contact information:   [address], [place] [address] [phone]        The results of significant diagnostics  from this hospitalization (including imaging, microbiology, ancillary and laboratory) are listed below for reference.    Significant Diagnostic Studies: No results found.  Microbiology: No results found for this or any previous visit (from the past 240 hour(s)).   Labs: Basic Metabolic Panel:  Recent Labs Lab 09/22/13 1400 09/23/13 0440  NA 139 139  K 4.2 4.6  CL 96 97  CO2 28 20  GLUCOSE 108* 225*  BUN 34* 45*  CREATININE 2.19* 2.13*  CALCIUM 8.9 8.0*   Liver Function Tests:  Recent Labs Lab 09/23/13 0440  AST 45*  ALT 10  ALKPHOS 100  BILITOT 1.9*  PROT 6.0  ALBUMIN 2.8*   No results found for this basename: LIPASE, AMYLASE,  in the last 168 hours No results found for this basename: AMMONIA,  in the last 168 hours CBC:  Recent Labs Lab 09/22/13 1400 09/23/13 0440  WBC 4.3 1.6*  NEUTROABS 2.1  --   HGB 10.2* 9.7*  HCT 33.3* 32.1*  MCV 94.3 94.7  PLT 82* 82*   Cardiac Enzymes: No results found for this basename: CKTOTAL, CKMB, CKMBINDEX, TROPONINI,  in the last 168 hours BNP: BNP (last 3 results) No results found for this basename: PROBNP,  in the last 8760 hours CBG:  Recent Labs Lab 09/22/13 2206 09/23/13 0805 09/23/13 1146  GLUCAP 169* 210* 178*    Signed:  CHIU, STEPHEN K  [facility] 09/23/2013, 2:41 PM

## 2013-09-23 NOTE — Progress Notes (Signed)
UR completed 

## 2013-09-23 NOTE — ED Provider Notes (Signed)
Medical screening examination/treatment/procedure(s) were conducted as a shared visit with non-physician practitioner(s) and myself.  I personally evaluated the patient during the encounter.  EKG Interpretation    Date/Time:    Ventricular Rate:    PR Interval:    QRS Duration:   QT Interval:    QTC Calculation:   R Axis:     Text Interpretation:               Orlie Dakin, MD 09/23/13 1342

## 2013-09-29 ENCOUNTER — Ambulatory Visit: Payer: Medicare Other

## 2013-09-29 ENCOUNTER — Other Ambulatory Visit (HOSPITAL_BASED_OUTPATIENT_CLINIC_OR_DEPARTMENT_OTHER): Payer: Medicare Other

## 2013-09-29 DIAGNOSIS — N183 Chronic kidney disease, stage 3 unspecified: Secondary | ICD-10-CM

## 2013-09-29 DIAGNOSIS — D631 Anemia in chronic kidney disease: Secondary | ICD-10-CM

## 2013-09-29 DIAGNOSIS — D649 Anemia, unspecified: Secondary | ICD-10-CM

## 2013-09-29 LAB — CBC WITH DIFFERENTIAL/PLATELET
BASO%: 2.1 % — ABNORMAL HIGH (ref 0.0–2.0)
BASOS ABS: 0.1 10*3/uL (ref 0.0–0.1)
EOS%: 0.8 % (ref 0.0–7.0)
Eosinophils Absolute: 0 10*3/uL (ref 0.0–0.5)
HEMATOCRIT: 33.4 % — AB (ref 38.4–49.9)
HEMOGLOBIN: 10.4 g/dL — AB (ref 13.0–17.1)
LYMPH#: 3.5 10*3/uL — AB (ref 0.9–3.3)
LYMPH%: 62.1 % — ABNORMAL HIGH (ref 14.0–49.0)
MCH: 28.8 pg (ref 27.2–33.4)
MCHC: 31.2 g/dL — ABNORMAL LOW (ref 32.0–36.0)
MCV: 92.4 fL (ref 79.3–98.0)
MONO#: 0.1 10*3/uL (ref 0.1–0.9)
MONO%: 1.9 % (ref 0.0–14.0)
NEUT%: 33.1 % — ABNORMAL LOW (ref 39.0–75.0)
NEUTROS ABS: 1.8 10*3/uL (ref 1.5–6.5)
Platelets: 85 10*3/uL — ABNORMAL LOW (ref 140–400)
RBC: 3.61 10*6/uL — ABNORMAL LOW (ref 4.20–5.82)
RDW: 23.4 % — ABNORMAL HIGH (ref 11.0–14.6)
WBC: 5.6 10*3/uL (ref 4.0–10.3)

## 2013-09-29 LAB — COMPREHENSIVE METABOLIC PANEL (CC13)
ALBUMIN: 3.4 g/dL — AB (ref 3.5–5.0)
ALT: 21 U/L (ref 0–55)
AST: 22 U/L (ref 5–34)
Alkaline Phosphatase: 111 U/L (ref 40–150)
Anion Gap: 13 mEq/L — ABNORMAL HIGH (ref 3–11)
BUN: 39.4 mg/dL — AB (ref 7.0–26.0)
CHLORIDE: 100 meq/L (ref 98–109)
CO2: 30 mEq/L — ABNORMAL HIGH (ref 22–29)
Calcium: 9.3 mg/dL (ref 8.4–10.4)
Creatinine: 1.8 mg/dL — ABNORMAL HIGH (ref 0.7–1.3)
Glucose: 274 mg/dl — ABNORMAL HIGH (ref 70–140)
POTASSIUM: 3.9 meq/L (ref 3.5–5.1)
Sodium: 143 mEq/L (ref 136–145)
Total Bilirubin: 1.42 mg/dL — ABNORMAL HIGH (ref 0.20–1.20)
Total Protein: 6 g/dL — ABNORMAL LOW (ref 6.4–8.3)

## 2013-09-29 LAB — HOLD TUBE, BLOOD BANK

## 2013-09-29 MED ORDER — EPOETIN ALFA 20000 UNIT/ML IJ SOLN
20000.0000 [IU] | Freq: Once | INTRAMUSCULAR | Status: DC
  Filled 2013-09-29: qty 1

## 2013-10-06 ENCOUNTER — Ambulatory Visit: Payer: Medicare Other

## 2013-10-06 ENCOUNTER — Other Ambulatory Visit (HOSPITAL_BASED_OUTPATIENT_CLINIC_OR_DEPARTMENT_OTHER): Payer: Medicare Other

## 2013-10-06 ENCOUNTER — Telehealth: Payer: Self-pay | Admitting: *Deleted

## 2013-10-06 ENCOUNTER — Encounter: Payer: Self-pay | Admitting: Hematology and Oncology

## 2013-10-06 ENCOUNTER — Ambulatory Visit (HOSPITAL_BASED_OUTPATIENT_CLINIC_OR_DEPARTMENT_OTHER): Payer: Medicare Other | Admitting: Hematology and Oncology

## 2013-10-06 VITALS — BP 96/44 | HR 97 | Temp 97.4°F | Resp 18 | Ht 66.0 in | Wt 148.4 lb

## 2013-10-06 DIAGNOSIS — N183 Chronic kidney disease, stage 3 (moderate): Principal | ICD-10-CM

## 2013-10-06 DIAGNOSIS — D696 Thrombocytopenia, unspecified: Secondary | ICD-10-CM

## 2013-10-06 DIAGNOSIS — D631 Anemia in chronic kidney disease: Secondary | ICD-10-CM

## 2013-10-06 DIAGNOSIS — D649 Anemia, unspecified: Secondary | ICD-10-CM

## 2013-10-06 DIAGNOSIS — C9200 Acute myeloblastic leukemia, not having achieved remission: Secondary | ICD-10-CM

## 2013-10-06 DIAGNOSIS — B029 Zoster without complications: Secondary | ICD-10-CM

## 2013-10-06 DIAGNOSIS — N189 Chronic kidney disease, unspecified: Secondary | ICD-10-CM

## 2013-10-06 HISTORY — DX: Zoster without complications: B02.9

## 2013-10-06 LAB — COMPREHENSIVE METABOLIC PANEL (CC13)
ALT: 14 U/L (ref 0–55)
AST: 23 U/L (ref 5–34)
Albumin: 3.1 g/dL — ABNORMAL LOW (ref 3.5–5.0)
Alkaline Phosphatase: 112 U/L (ref 40–150)
Anion Gap: 11 mEq/L (ref 3–11)
BUN: 36.1 mg/dL — ABNORMAL HIGH (ref 7.0–26.0)
CALCIUM: 8.9 mg/dL (ref 8.4–10.4)
CHLORIDE: 96 meq/L — AB (ref 98–109)
CO2: 31 mEq/L — ABNORMAL HIGH (ref 22–29)
CREATININE: 2.1 mg/dL — AB (ref 0.7–1.3)
Glucose: 279 mg/dl — ABNORMAL HIGH (ref 70–140)
Potassium: 4.2 mEq/L (ref 3.5–5.1)
SODIUM: 138 meq/L (ref 136–145)
TOTAL PROTEIN: 5.9 g/dL — AB (ref 6.4–8.3)
Total Bilirubin: 1.56 mg/dL — ABNORMAL HIGH (ref 0.20–1.20)

## 2013-10-06 LAB — CBC WITH DIFFERENTIAL/PLATELET
HCT: 30.2 % — ABNORMAL LOW (ref 38.4–49.9)
HEMOGLOBIN: 9.1 g/dL — AB (ref 13.0–17.1)
MCH: 27.7 pg (ref 27.2–33.4)
MCHC: 30.1 g/dL — ABNORMAL LOW (ref 32.0–36.0)
MCV: 91.8 fL (ref 79.3–98.0)
Platelets: 89 10*3/uL — ABNORMAL LOW (ref 140–400)
RBC: 3.29 10*6/uL — AB (ref 4.20–5.82)
RDW: 22.3 % — AB (ref 11.0–14.6)
WBC: 12.5 10*3/uL — ABNORMAL HIGH (ref 4.0–10.3)

## 2013-10-06 LAB — MANUAL DIFFERENTIAL
ALC: 2 10*3/uL (ref 0.9–3.3)
ANC (CHCC manual diff): 9.2 10*3/uL — ABNORMAL HIGH (ref 1.5–6.5)
BAND NEUTROPHILS: 20 % — AB (ref 0–10)
BASOPHIL: 1 % (ref 0–2)
Blasts: 4 % — ABNORMAL HIGH (ref 0–0)
EOS%: 1 % (ref 0–7)
LYMPH: 16 % (ref 14–49)
MONO: 4 % (ref 0–14)
Metamyelocytes: 2 % — ABNORMAL HIGH (ref 0–0)
PLT EST: DECREASED
SEG: 52 % (ref 38–77)
nRBC: 2 % — ABNORMAL HIGH (ref 0–0)

## 2013-10-06 LAB — HOLD TUBE, BLOOD BANK

## 2013-10-06 MED ORDER — ACYCLOVIR 200 MG PO CAPS: 200.0000 mg | ORAL_CAPSULE | Freq: Every day | ORAL | Status: AC

## 2013-10-06 NOTE — Telephone Encounter (Signed)
Hospice referral made. 

## 2013-10-06 NOTE — Progress Notes (Signed)
[facility] OFFICE PROGRESS NOTE  Patient Care Team: Gaynelle Arabian, MD as PCP - General (Family Medicine) Jacolyn Reedy, MD as Consulting Physician (Cardiology) Rexene Agent, MD as Referring Physician (Nephrology) Heath Lark, MD as Consulting Physician (Hematology and Oncology)  DIAGNOSIS: Acute myelogenous leukemia  SUMMARY OF ONCOLOGIC HISTORY: This is a pleasant 70 your gentleman with background history of severe dysarthria, severe anemia and thrombocytopenia. I saw the patient approximately one month ago and noted dysplastic cells on his peripheral smear, suspect diagnosis of AML.  INTERVAL HISTORY: [name] 80 y.o. male returns for further followup. History is impossible due to expressive dysphasia His family has noted significant recurrent acute gout attack in his right ankle. He was also noted to have passage of dark urine. There were no reported recent history of infection. The patient denies any recent signs or symptoms of bleeding such as spontaneous epistaxis, hematuria or hematochezia. The patient was noted to have significant skin itching and was noted to have significant rash behind the left calf I have reviewed the past medical history, past surgical history, social history and family history with the patient and they are unchanged from previous note.  ALLERGIES:  is allergic to shellfish allergy; diltiazem; ranolazine; statins; amlodipine; and clopidogrel.  MEDICATIONS:  Current Outpatient Prescriptions  Medication Sig Dispense Refill  . allopurinol (ZYLOPRIM) 100 MG tablet       . brimonidine (ALPHAGAN) 0.15 % ophthalmic solution Place 1 drop into both eyes 2 (two) times daily.      . cholecalciferol (VITAMIN D) 1000 UNITS tablet Take 1,000 Units by mouth daily.      . colchicine 0.6 MG tablet Take 0.6 mg by mouth 2 (two) times daily. For gout attacks he takes it twice a day for 4 days.      Marland Kitchen dipyridamole-aspirin (AGGRENOX) 200-25 MG per 12  hr capsule Take 1 capsule by mouth 2 (two) times daily.      Marland Kitchen doxepin (SINEQUAN) 10 MG capsule       . Epoetin Alfa (PROCRIT IJ) Inject 20,000 Units as directed every 7 (seven) days. For Renal failure. Dosed by Dr. Alvy Bimler.      . finasteride (PROSCAR) 5 MG tablet Take 5 mg by mouth daily.      . furosemide (LASIX) 40 MG tablet Take 40 mg by mouth daily.      . hydrOXYzine (ATARAX/VISTARIL) 25 MG tablet Take 25 mg by mouth every 6 (six) hours as needed for itching.      . insulin NPH (HUMULIN N,NOVOLIN N) 100 UNIT/ML injection Inject 6-12 Units into the skin 2 (two) times daily at 8 am and 10 pm.       . latanoprost (XALATAN) 0.005 % ophthalmic solution Place 1 drop into both eyes at bedtime.      . nitroGLYCERIN (NITROSTAT) 0.4 MG SL tablet Place 0.4 mg under the tongue every 5 (five) minutes as needed for chest pain.      Marland Kitchen omeprazole (PRILOSEC) 20 MG capsule Take 20 mg by mouth daily.      Marland Kitchen oxyCODONE-acetaminophen (PERCOCET/ROXICET) 5-325 MG per tablet Take 1 tablet by mouth every 6 (six) hours as needed for moderate pain.      . predniSONE (DELTASONE) 20 MG tablet Take 3 tablets (60 mg total) by mouth daily with breakfast.      . acyclovir (ZOVIRAX) 200 MG capsule Take 1 capsule (200 mg total) by mouth 5 (five) times daily.  35 capsule  0  No current facility-administered medications for this visit.    REVIEW OF SYSTEMS:  In accurate due to his dysphasia Constitutional: Denies fevers, chills. He was noted to 10 pound weight loss 4 were month Eyes: Denies blurriness of vision Ears, nose, mouth, throat, and face: Denies mucositis or sore throat Respiratory: Denies cough, dyspnea or wheezes Cardiovascular: Denies palpitation, chest discomfort or lower extremity swelling Gastrointestinal:  Denies nausea, heartburn or change in bowel habits Lymphatics: Denies new lymphadenopathy or easy bruising Neurological:Denies numbness, tingling or new weaknesses Behavioral/Psych: Mood is stable, no  new changes  All other systems were reviewed with the patient and are negative.  PHYSICAL EXAMINATION: ECOG PERFORMANCE STATUS: 1 - Symptomatic but completely ambulatory  Filed Vitals:   10/06/13 1146  BP: 96/44  Pulse: 97  Temp: 97.4 F (36.3 C)  Resp: 18   Filed Weights   10/06/13 1146  Weight: 148 lb 6.4 oz (67.314 kg)    GENERAL:alert, no distress and comfortable SKIN: Noted outbreak of shingles on the posterior part of his left thigh. Noted thin skin and bruising extensively throughout EYES: normal, Conjunctiva are pink and non-injected, sclera clear OROPHARYNX:no exudate, no erythema and lips, buccal mucosa, and tongue normal  NECK: supple, thyroid normal size, non-tender, without nodularity LYMPH:  no palpable lymphadenopathy in the cervical, axillary or inguinal LUNGS: clear to auscultation and percussion with normal breathing effort HEART: regular rate & rhythm and no murmurs and no lower extremity edema ABDOMEN:abdomen soft, non-tender and normal bowel sounds Musculoskeletal:no cyanosis of digits and no clubbing . Noted swelling on the right ankle suspicious for recurrent gout attack NEURO: alert & significant expressive dysphasia  LABORATORY DATA:  I have reviewed the data as listed    Component Value Date/Time   NA 138 10/06/2013 1129   NA 139 09/23/2013 0440   K 4.2 10/06/2013 1129   K 4.6 09/23/2013 0440   CL 97 09/23/2013 0440   CO2 31* 10/06/2013 1129   CO2 20 09/23/2013 0440   GLUCOSE 279* 10/06/2013 1129   GLUCOSE 225* 09/23/2013 0440   BUN 36.1* 10/06/2013 1129   BUN 45* 09/23/2013 0440   CREATININE 2.1* 10/06/2013 1129   CREATININE 2.13* 09/23/2013 0440   CALCIUM 8.9 10/06/2013 1129   CALCIUM 8.0* 09/23/2013 0440   CALCIUM 9.2 03/04/2013 1159   PROT 5.9* 10/06/2013 1129   PROT 6.0 09/23/2013 0440   ALBUMIN 3.1* 10/06/2013 1129   ALBUMIN 2.8* 09/23/2013 0440   AST 23 10/06/2013 1129   AST 45* 09/23/2013 0440   ALT 14 10/06/2013 1129   ALT 10 09/23/2013 0440   ALKPHOS 112  10/06/2013 1129   ALKPHOS 100 09/23/2013 0440   BILITOT 1.56* 10/06/2013 1129   BILITOT 1.9* 09/23/2013 0440   GFRNONAA 27* 09/23/2013 0440   GFRAA 31* 09/23/2013 0440    No results found for this basename: SPEP, UPEP,  kappa and lambda light chains    Lab Results  Component Value Date   WBC 12.5* 10/06/2013   NEUTROABS 1.8 09/29/2013   HGB 9.1* 10/06/2013   HCT 30.2* 10/06/2013   MCV 91.8 10/06/2013   PLT 89* 10/06/2013      Chemistry      Component Value Date/Time   NA 138 10/06/2013 1129   NA 139 09/23/2013 0440   K 4.2 10/06/2013 1129   K 4.6 09/23/2013 0440   CL 97 09/23/2013 0440   CO2 31* 10/06/2013 1129   CO2 20 09/23/2013 0440   BUN 36.1* 10/06/2013 1129  BUN 45* 09/23/2013 0440   CREATININE 2.1* 10/06/2013 1129   CREATININE 2.13* 09/23/2013 0440      Component Value Date/Time   CALCIUM 8.9 10/06/2013 1129   CALCIUM 8.0* 09/23/2013 0440   CALCIUM 9.2 03/04/2013 1159   ALKPHOS 112 10/06/2013 1129   ALKPHOS 100 09/23/2013 0440   AST 23 10/06/2013 1129   AST 45* 09/23/2013 0440   ALT 14 10/06/2013 1129   ALT 10 09/23/2013 0440   BILITOT 1.56* 10/06/2013 1129   BILITOT 1.9* 09/23/2013 0440      ASSESSMENT & PLAN:  #1 acute myelogenous leukemia #2 severe anemia #3 thrombocytopenia #4 chronic kidney disease #5 shingles outbreak #6 suspect recurrent gout attack I have a long discussion with the patient and family. The patient appears to understand his current situation through gestures. I suspect he has progression of his AML The shingles outbreak and the recurrent gout is likely related to that as well With a compassionate manner, I recommend family members to stop transfusion support. I recommend hospice care. For this shingles outbreak, I recommend starting him on reduced dose of acyclovir 200 mg, 5 times a day for 7 days. I have not make a return appointment for the patient to come back.  All questions were answered. The patient knows to call the clinic with any problems, questions or concerns. No  barriers to learning was detected. I spent 25 minutes counseling the patient face to face. The total time spent in the appointment was 40 minutes and more than 50% was on counseling and review of test results     [facility], [place], MD 10/06/2013 12:43 PM

## 2013-10-08 ENCOUNTER — Telehealth: Payer: Self-pay | Admitting: *Deleted

## 2013-10-08 NOTE — Telephone Encounter (Signed)
Call from [place],  Jeani Hawking, states pt having acute flare up of gout and has been taking one percocet q 6hrs prn w/o relief of pain.   Per Dr. Alvy Bimler,  May increase percocet to 2 tabs q 4hrs prn pain and have Hospice MD assess/ manage pain.  Jeani Hawking verbalized understanding.

## 2013-10-13 ENCOUNTER — Other Ambulatory Visit: Payer: Medicare Other

## 2013-10-13 ENCOUNTER — Ambulatory Visit: Payer: Medicare Other

## 2013-10-15 ENCOUNTER — Other Ambulatory Visit: Payer: Self-pay | Admitting: *Deleted

## 2013-10-15 MED ORDER — OXYCODONE-ACETAMINOPHEN 5-325 MG PO TABS: 2.0000 | ORAL_TABLET | ORAL | Status: AC | PRN

## 2013-10-15 NOTE — Telephone Encounter (Signed)
Faxed Rx to [facility] pharmacy and notified Hospice nurse.

## 2013-10-15 NOTE — Telephone Encounter (Signed)
Call from [place],  [facility] RN,  States pt needs refill on his percocet faxed to [facility].

## 2013-10-26 DEATH — deceased

## 2014-08-15 ENCOUNTER — Other Ambulatory Visit: Payer: Self-pay | Admitting: Nurse Practitioner
